# Patient Record
Sex: Female | Born: 1995 | Race: White | Hispanic: No | Marital: Single | Smoking: Never smoker
Health system: Southern US, Community
[De-identification: ages and names within clinical notes are randomized; demographics above are authoritative.]

## PROBLEM LIST (undated history)

## (undated) DIAGNOSIS — F32A Depression, unspecified: Secondary | ICD-10-CM

## (undated) DIAGNOSIS — S12500A Unspecified displaced fracture of sixth cervical vertebra, initial encounter for closed fracture: Secondary | ICD-10-CM

## (undated) DIAGNOSIS — G43909 Migraine, unspecified, not intractable, without status migrainosus: Secondary | ICD-10-CM

## (undated) DIAGNOSIS — S060X9A Concussion with loss of consciousness of unspecified duration, initial encounter: Secondary | ICD-10-CM

## (undated) DIAGNOSIS — B0089 Other herpesviral infection: Secondary | ICD-10-CM

## (undated) DIAGNOSIS — F419 Anxiety disorder, unspecified: Secondary | ICD-10-CM

## (undated) DIAGNOSIS — E282 Polycystic ovarian syndrome: Secondary | ICD-10-CM

## (undated) DIAGNOSIS — M545 Low back pain, unspecified: Secondary | ICD-10-CM

## (undated) DIAGNOSIS — N159 Renal tubulo-interstitial disease, unspecified: Secondary | ICD-10-CM

## (undated) DIAGNOSIS — S060XAA Concussion with loss of consciousness status unknown, initial encounter: Secondary | ICD-10-CM

## (undated) HISTORY — DX: Polycystic ovarian syndrome: E28.2

## (undated) HISTORY — DX: Other herpesviral infection: B00.89

## (undated) HISTORY — DX: Unspecified displaced fracture of sixth cervical vertebra, initial encounter for closed fracture: S12.500A

## (undated) HISTORY — DX: Concussion with loss of consciousness of unspecified duration, initial encounter: S06.0X9A

## (undated) HISTORY — DX: Renal tubulo-interstitial disease, unspecified: N15.9

## (undated) HISTORY — DX: Anxiety disorder, unspecified: F41.9

## (undated) HISTORY — DX: Concussion with loss of consciousness status unknown, initial encounter: S06.0XAA

## (undated) HISTORY — DX: Low back pain, unspecified: M54.50

## (undated) HISTORY — DX: Migraine, unspecified, not intractable, without status migrainosus: G43.909

## (undated) HISTORY — DX: Depression, unspecified: F32.A

---

## 2018-06-08 ENCOUNTER — Emergency Department: Payer: Self-pay

## 2018-06-08 ENCOUNTER — Emergency Department
Admission: EM | Admit: 2018-06-08 | Discharge: 2018-06-08 | Disposition: A | Discharge status: Home / Self Care | Payer: Worker's Comp, Other unspecified | Attending: Emergency Medical Services | Admitting: Emergency Medical Services

## 2018-06-08 DIAGNOSIS — Y9389 Activity, other specified: Secondary | ICD-10-CM | POA: Insufficient documentation

## 2018-06-08 DIAGNOSIS — M545 Low back pain, unspecified: Secondary | ICD-10-CM

## 2018-06-08 DIAGNOSIS — Y990 Civilian activity done for income or pay: Secondary | ICD-10-CM | POA: Insufficient documentation

## 2018-06-08 DIAGNOSIS — N390 Urinary tract infection, site not specified: Secondary | ICD-10-CM | POA: Insufficient documentation

## 2018-06-08 DIAGNOSIS — R319 Hematuria, unspecified: Secondary | ICD-10-CM | POA: Insufficient documentation

## 2018-06-08 DIAGNOSIS — X509XXA Other and unspecified overexertion or strenuous movements or postures, initial encounter: Secondary | ICD-10-CM | POA: Insufficient documentation

## 2018-06-08 DIAGNOSIS — Y92538 Other ambulatory health services establishments as the place of occurrence of the external cause: Secondary | ICD-10-CM | POA: Insufficient documentation

## 2018-06-08 LAB — URINALYSIS REFLEX TO MICROSCOPIC EXAM - REFLEX TO CULTURE
Bilirubin, UA: NEGATIVE
Glucose, UA: NEGATIVE
Ketones UA: NEGATIVE
Nitrite, UA: NEGATIVE
Protein, UR: 30 — AB
Specific Gravity UA: 1.024 (ref 1.001–1.035)
Urine pH: 6 (ref 5.0–8.0)
Urobilinogen, UA: NORMAL mg/dL (ref 0.2–2.0)

## 2018-06-08 LAB — HCG, SERUM, QUALITATIVE: Hcg Qualitative: NEGATIVE

## 2018-06-08 MED ORDER — DIAZEPAM 5 MG PO TABS
5.00 mg | ORAL_TABLET | Freq: Once | ORAL | Status: AC
Start: 2018-06-08 — End: 2018-06-08
  Administered 2018-06-08: 17:00:00 5 mg via ORAL
  Filled 2018-06-08: qty 1

## 2018-06-08 MED ORDER — ONDANSETRON HCL 4 MG/2ML IJ SOLN
4.00 mg | Freq: Once | INTRAMUSCULAR | Status: AC
Start: 2018-06-08 — End: 2018-06-08
  Administered 2018-06-08: 17:00:00 4 mg via INTRAVENOUS
  Filled 2018-06-08: qty 2

## 2018-06-08 MED ORDER — PREDNISONE 20 MG PO TABS
60.00 mg | ORAL_TABLET | Freq: Every day | ORAL | 0 refills | Status: AC
Start: 2018-06-08 — End: 2018-06-13

## 2018-06-08 MED ORDER — AMOXICILLIN-POT CLAVULANATE 875-125 MG PO TABS
1.00 | ORAL_TABLET | Freq: Two times a day (BID) | ORAL | 0 refills | Status: AC
Start: 2018-06-08 — End: 2018-06-15

## 2018-06-08 MED ORDER — OXYCODONE-ACETAMINOPHEN 5-325 MG PO TABS
ORAL_TABLET | ORAL | 0 refills | Status: AC
Start: 2018-06-08 — End: ?

## 2018-06-08 MED ORDER — DIAZEPAM 5 MG PO TABS
5.00 mg | ORAL_TABLET | Freq: Four times a day (QID) | ORAL | 0 refills | Status: AC | PRN
Start: 2018-06-08 — End: ?

## 2018-06-08 MED ORDER — HYDROMORPHONE HCL 0.5 MG/0.5 ML IJ SOLN
0.50 mg | Freq: Once | INTRAMUSCULAR | Status: AC
Start: 2018-06-08 — End: 2018-06-08
  Administered 2018-06-08: 0.5 mg via INTRAVENOUS
  Filled 2018-06-08: qty 0.5

## 2018-06-08 MED ORDER — SODIUM CHLORIDE 0.9 % IV BOLUS
1000.00 mL | Freq: Once | INTRAVENOUS | Status: AC
Start: 2018-06-08 — End: 2018-06-08
  Administered 2018-06-08: 17:00:00 1000 mL via INTRAVENOUS

## 2018-06-08 NOTE — Discharge Instructions (Signed)
Dear Ms. [name] [name]:    I appreciate your choosing the [facility] for your healthcare needs, and hope your visit today was EXCELLENT.    Instructions:  Please follow-up with the [facility] and your primary doctor in 1-2 days. If you don't have a primary doctor, you can see Dr. Stacie Glaze.    Return to the Emergency Department for any worsening symptoms or concerns.    Below is some information that our patients often find helpful.    We wish you good health and please do not hesitate to contact us if we can ever be of any assistance.    Sincerely,  Harden Mo, MD  [facility]    ________________________________________________________________    If you do not continue to improve or your condition worsens, please contact your doctor or return immediately to the Emergency Department.    Thank you for choosing [facility] for your emergency care needs.  We strive to provide EXCELLENT care to you and your family.      DOCTOR REFERRALS  Call [phone] if you need any further referrals and we can help you find a primary care doctor or specialist.  Also, available online at:  [email or web address]    YOUR CONTACT INFORMATION  Before leaving please check with registration to make sure we have an up-to-date contact number.  You can call registration at [phone] to update your information.  For questions about your hospital bill, please call [phone].  For questions about your Emergency Dept Physician bill please call [phone].      FREE HEALTH SERVICES  If you need help with health or social services, please call 2-1-1 for a free referral to resources in your area.  2-1-1 is a free service connecting people with information on health insurance, free clinics, pregnancy, mental health, dental care, food assistance, housing, and substance abuse counseling.  Also, available online at:  http://www.[facility].org    MEDICAL  RECORDS AND TESTS  Certain laboratory test results do not come back the same day, for example urine cultures.   We will contact you if other important findings are noted.  Radiology films are often reviewed again to ensure accuracy.  If there is any discrepancy, we will notify you.      Please call [phone] to pick up a complimentary CD of any radiology studies performed.  If you or your doctor would like to request a copy of your medical records, please call [phone].      ORTHOPEDIC INJURY   Please know that significant injuries can exist even when an initial x-ray is read as normal or negative.  This can occur because some fractures (broken bones) are not initially visible on x-rays.  For this reason, close outpatient follow-up with your primary care doctor or bone specialist (orthopedist) is required.    MEDICATIONS AND FOLLOWUP  Please be aware that some prescription medications can cause drowsiness.  Use caution when driving or operating machinery.    The examination and treatment you have received in our Emergency Department is provided on an emergency basis, and is not intended to be a substitute for your primary care physician.  It is important that your doctor checks you again and that you report any new or remaining problems at that time.      24 HOUR PHARMACIES  [facility] - [address] [place], [place] [address] (1.4 miles, 7 minutes)  [facility] -  [address], [place],  [address] (6.5 miles, 13 minutes)  Handout with directions available on request    PATIENT RELATIONS  If you have any concerns, issues, or feedback related to your care, positive or negative, please do not hesitate to contact Patient Relations at [phone]. They are open from 8:30AM-5:00PM Monday through Friday.

## 2018-06-08 NOTE — ED Provider Notes (Signed)
Physician/Midlevel provider first contact with patient: 06/08/18 1453         EMERGENCY DEPARTMENT HISTORY AND PHYSICAL EXAM    Date: 06/08/18  Patient Name: [name] [name]  Attending Physician: Blanche East, MD  Patient DOB:  10/29/1995  MRN:  [id number]  Room:  33/SS 33        History of Presenting Illness     Chief Complaint:    Chief Complaint   Patient presents with    Back Pain       Historian: Patient with mom    23 y.o. female with h/o C spine fx s/t falling on ice as a child, p/w sudden onset of worsening lower back pain s/t an injury at work yesterday. Pt reports she was bending over to pick up an 80 lb dog, when the dog thrashed and pushed pt backwards. She denies any direct trauma to her back. She states her back pain was initially mild, but she noticed that it had worsened significantly when attempting to stand up from a seated position. Denies any bowel or bladder incontinence. She does note some BLE weakness, but denies paresthesias. She states she has been able to ambulate despite her back pain.      PMD: Pcp, None, MD      Past Medical History     Past Medical History:   Diagnosis Date    Anxiety     C6 cervical fracture     Concussion     Depression     Herpes simplex virus type 1 (HSV-1) dermatitis     Kidney infection     Migraines     PCOS (polycystic ovarian syndrome)        Past Surgical History     Past Surgical History:   Procedure Laterality Date    ADENOIDECTOMY      TONSILLECTOMY      WISDOM TOOTH EXTRACTION         Family History     History reviewed. No pertinent family history.    Social History     Social History     Socioeconomic History    Marital status: Single     Spouse name: Not on file    Number of children: Not on file    Years of education: Not on file    Highest education level: Not on file   Occupational History    Not on file   Social Needs    Financial resource strain: Not on file    Food insecurity:     Worry: Not on file     Inability: Not on file     Transportation needs:     Medical: Not on file     Non-medical: Not on file   Tobacco Use    Smoking status: Never Smoker    Smokeless tobacco: Never Used   Substance and Sexual Activity    Alcohol use: Yes     Comment: socially    Drug use: Never    Sexual activity: Not on file   Lifestyle    Physical activity:     Days per week: Not on file     Minutes per session: Not on file    Stress: Not on file   Relationships    Social connections:     Talks on phone: Not on file     Gets together: Not on file     Attends religious service: Not on file     Active member of club  or organization: Not on file     Attends meetings of clubs or organizations: Not on file     Relationship status: Not on file    Intimate partner violence:     Fear of current or ex partner: Not on file     Emotionally abused: Not on file     Physically abused: Not on file     Forced sexual activity: Not on file   Other Topics Concern    Not on file   Social History Narrative    Not on file       Allergies     Allergies   Allergen Reactions    Bactrim [Sulfamethoxazole-Trimethoprim]        Home Medications     Home medications reviewed by ED MD     Previous Medications    NORETHINDRONE ACET-ETHINYL EST (JUNEL 1/20 PO)    Take by mouth    VALACYCLOVIR HCL (VALTREX PO)    Take by mouth         Review of Systems     Constitutional:  No fever  Eyes: No discharge   ENT: No ST  CV:  No CP   Resp:  No SOB or cough  GI: No abd pain, N, V, D, or bowel incontinence.  GU: No dysuria or bladder incontinence  MS: +Back pain. +BLE weakness. No paresthesias.  Skin: No rash  Neuro:  No HA  Psych:  No behavior changes  All other systems reviewed and negative      Physical Exam     BP (!) 170/92    Pulse (!) 102    Temp 98.1 F (36.7 C) (Oral)    Resp 22    Wt 101 kg    LMP 05/08/2018 (Approximate)    SpO2 98%       CONSTITUTIONAL Patient is afebrile, Vital signs reviewed, hypertensive, tachycardic.  HEAD Atraumatic, Normocephalic.  EYES No discharge from  eyes, Sclera are normal.  NECK   Normal ROM, Cervical spine nontender  RESPIRATORY CHEST Chest is nontender, Breath sounds normal, No respiratory distress.  CARDIOVASCULAR Tachycardic rate, Heart sounds normal.  ABDOMEN Abdomen is nontender, No peritoneal signs, No distension  BACK   Paravertebral L spine TTP. Straight leg raise negative bilaterally. There is no CVA tenderness.  UPPER EXTREMITY No cyanosis, No edema  LOWER EXTREMITY Good strength bilateral legs. Pt is able to lift both legs off of the bed.  NEURO GCS is 15, No focal motor deficits, No focal sensory deficits.  SKIN Skin is warm, Skin is dry.  PSYCHIATRIC Normal affect, Normal insight      Monitors, EKG     EKG (interpreted by ED physician): na      Orders Placed During This Encounter     Orders Placed This Encounter   Procedures    Urine culture    CT L- Spine without Contrast    Beta HCG, Qual, Serum    UA Reflex to Micro - Reflex to Culture         ED Medications Administered     ED Medication Orders (From admission, onward)    Start Ordered     Status Ordering Provider    06/08/18 1500 06/08/18 1459  HYDROmorphone (DILAUDID) injection 0.5 mg  Once     Route: Intravenous  Ordered Dose: 0.5 mg     Last MAR action:  Given Raeden Schippers L    06/08/18 1500 06/08/18 1459  ondansetron (ZOFRAN) injection 4 mg  Once     Route: Intravenous  Ordered Dose: 4 mg     Last MAR action:  Given Cassandra Harbold L    06/08/18 1500 06/08/18 1459  sodium chloride 0.9 % bolus 1,000 mL  Once     Route: Intravenous  Ordered Dose: 1,000 mL     Last MAR action:  [employer], Janiel Derhammer L    06/08/18 1500 06/08/18 1459  diazePAM (VALIUM) tablet 5 mg  Once     Route: Oral  Ordered Dose: 5 mg     Last MAR action:  Given Leopold Smyers, Kellogg L                Data Review     Nursing Records Reviewed and Agree: Yes  Laboratory results reviewed by ED provider: if applicable yes  Radiologic study results reviewed by ED provider:  If applicable yes      I, Blanche East, MD, personally performed the  services documented. Skip Estimable is scribing for me on [name],[name]. I reviewed and confirm the accuracy of the information in this medical record.    Tacy Dura, am serving as a Neurosurgeon to document services personally performed by Blanche East, MD, based on the provider's statements to me.     Credentials: Skip Estimable, scribe    Rendering Provider: Blanche East, MD      Diagnostic Study Results     Labs     Results     Procedure Component Value Units Date/Time    Beta HCG, Drema Dallas Serum [[id number]] Collected:  06/08/18 1552    Specimen:  Blood Updated:  06/08/18 1614     Hcg Qualitative Negative    UA Reflex to Micro - Reflex to Culture [[id number]]  (Abnormal) Collected:  06/08/18 1552     Updated:  06/08/18 1612     Urine Type Urine, Clean Ca     Color, UA Yellow     Clarity, UA Hazy     Specific Gravity UA 1.024     Urine pH 6.0     Leukocyte Esterase, UA Large     Nitrite, UA Negative     Protein, UR 30     Glucose, UA Negative     Ketones UA Negative     Urobilinogen, UA Normal mg/dL      Bilirubin, UA Negative     Blood, UA Small     RBC, UA 11 - 25 /hpf      WBC, UA 11 - 25 /hpf      Squamous Epithelial Cells, Urine 6 - 10 /hpf      Urine Mucus Present    Narrative:       Replace urinary catheter prior to obtaining the urine culture  if it has been in place for greater than or equal to 14  days:->N/A No Foley  Indications for U/A Reflex to Micro - Reflex to  Culture:->Suprapubic Pain/Tenderness or Dysuria          Radiologic Studies  Radiology Results (24 Hour)     Procedure Component Value Units Date/Time    CT L- Spine without Contrast [[id number]] Collected:  06/08/18 1655    Order Status:  Completed Updated:  06/08/18 1701    Narrative:       HISTORY: 23 years old, pain        TECHNIQUE: Routine CT lumbar spine without  contrast.  Dose reduction with automatic exposure control, iterative  reconstruction, and/or adjustment of the mA and/or kV  according to  patient size.  COMPARISON: [name].  FINDINGS:                There is no evidence of pars defect.          The bones are without evidence of lytic or blastic lesion.        MRI is more accurate in evaluation for disc herniation and central canal  pathology with following findings on current CT scan.   Segmental analysis as below:      At L1-L2: No herniation or stenosis.  At L2-L3: No herniation or stenosis.  At L3-L4: No herniation or stenosis.  At L4-L5: No herniation or stenosis.  At L5-S1: No herniation or stenosis.      Impression:           1.  Negative CT LUMBAR spine for acute process.              Einar Pheasant, MD   06/08/2018 4:57 PM      .        Procedures     na    Clinical Notes, Consults & Reevaluations, and MDM     Differential Diagnoses:   2:56 PM Early differential includes, but is not limited to: herniated disc, musculoskeletal strain, L spine fx vs contusion, pregnancy, UTI.      Consults/Reevaluation:   5:04 PM Re-eval:   Feeling much better, VS stable,  no acute distress, looks well.     Counseled re dx  Counseled re follow up  Answered all questions  Counseled red flags and signs and sxs to return for.  Comfortable with follow up and discharge plan    Return Precautions  The patient is aware that this evaluation is only a screening for emergent conditions related to his or her symptoms and presentation.   I discussed the need for prompt follow-up.  Patient demonstrates verbal understanding.  Patient advised to return to the ED for any worsening symptoms, uncontrolled pain if applicable, worsening fevers, or any changes in their condition prompting concern and need for repeat evaluation and/or additional management.      MDM:   5:05 PM Hx, PEx, labs, and imaging were done to evaluate pt. Pt will be started on abx for UTI. She will also f/u with the [name] for back pain.      Diagnosis and Disposition   Diagnosis/Clinical Impression:  1. Low back pain, unspecified back pain laterality, unspecified chronicity, unspecified whether sciatica  present    2. Urinary tract infection with hematuria, site unspecified        Disposition  ED Disposition     ED Disposition Condition Date/Time Comment    Discharge  Fri Jun 08, 2018  5:05 PM Glenice Laine discharge to home/self care.    Condition at disposition: Stable          Prescriptions    New Prescriptions    AMOXICILLIN-CLAVULANATE (AUGMENTIN) 875-125 MG PER TABLET    Take 1 tablet by mouth 2 (two) times daily for 7 days    DIAZEPAM (VALIUM) 5 MG TABLET    Take 1 tablet (5 mg total) by mouth every 6 (six) hours as needed for Anxiety    OXYCODONE-ACETAMINOPHEN (PERCOCET) 5-325 MG PER TABLET    1 tab every 4 to 6 hours as needed for pain    PREDNISONE (DELTASONE) 20 MG TABLET    Take 3 tablets (60 mg total) by mouth daily for 5  days         Critical Care     na    Signout If Applicable     na       Harden Mo, MD  06/08/18 2131

## 2018-06-08 NOTE — ED Triage Notes (Signed)
Pt c/o lower back pain that is "on my spine" with movement, especially when going from sitting to standing or standing to sitting. 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 Went to [facility] last night and was given RXs for flexeril and tylenol 3 with minimal relief.

## 2018-06-11 ENCOUNTER — Ambulatory Visit: Payer: Self-pay

## 2018-06-11 ENCOUNTER — Telehealth: Payer: Self-pay

## 2018-06-11 NOTE — Progress Notes (Signed)
06/11/18 0800   Pain History   Pain Symptoms Pain   Pain Location Lower Back;Mid Back;Upper Back;Leg  Right;Leg  Left;Buttock  Right;Buttock  Left   Pain Description Radiating;Acute (<3 months);Sharp/Stabbing;Dull/Aching   Pain Frequency Constant   Sensation Symptoms Weakness   Sensation Location Leg  Right;Leg  Left   Sensation Frequency Intermittent   Foot Drop No   Incontinence No   Treatments   Have you visited a chiropractor for this problem? No   Have you ever had physical therapy for this problem? No   Have you ever received an injection for this problem? No   Diagnostic Tests   CT Scan Lumbar   Care Management   How did you hear about our program ED/[facility]  [facility] ED)   Have you had any prior spine surgeries? No   ISI Appointment   ISI Physician   Carlynn Purl, Mikle Bosworth)   ISI Appointment Date 06/12/18  (2:00 PM)   ISI Appointment Location [facility]

## 2018-06-12 ENCOUNTER — Other Ambulatory Visit: Payer: Self-pay | Admitting: Physical Medicine & Rehabilitation

## 2018-06-12 DIAGNOSIS — G589 Mononeuropathy, unspecified: Secondary | ICD-10-CM

## 2018-06-20 ENCOUNTER — Ambulatory Visit: Payer: Commercial Managed Care - POS

## 2018-09-21 NOTE — Progress Notes (Signed)
09/21/18 1500   ISI Appointment   ISI Physician Anastasia Pall, Mikle Bosworth   ISI Appointment Date 06/12/18  (2:00 PM)   ISI Appointment Location [facility]

## 2020-03-13 IMAGING — MR MRI CSPINE WO CONTRAST
5 series · 41 of 48 positions shown · non-contrast
Comparison: MRI brain study from same day.

HISTORY: Cervicalgia.
TECHNIQUE: Multiplanar, multisequential MRI of the cervical spine without intravenous contrast was performed.

[Series 1: bSSFP · axial · 6.0mm · 1.17mm/px · z∈[-51,+142]mm · 12 of 22 slices shown]
[im 1/22]
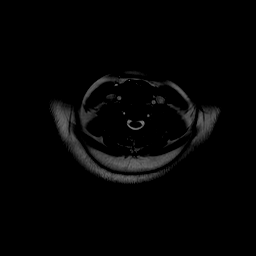
[im 2/22]
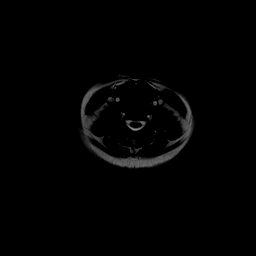
[im 4/22]
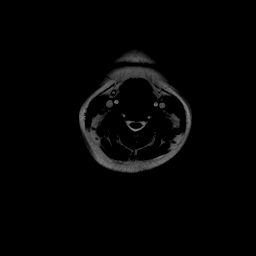
[im 6/22]
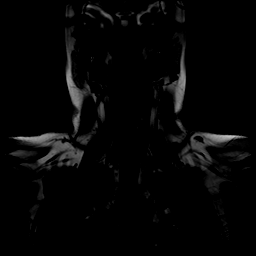
[im 8/22]
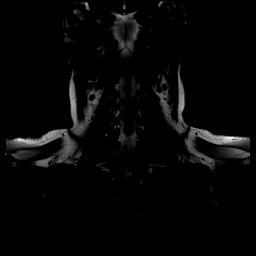
[im 10/22]
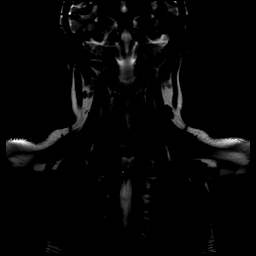
[im 12/22]
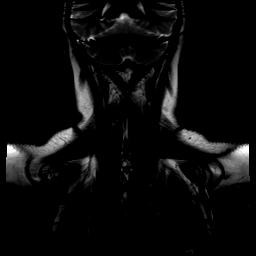
[im 14/22]
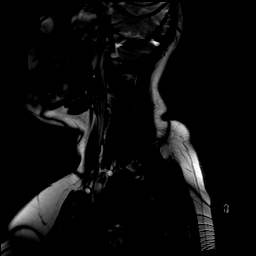
[im 16/22]
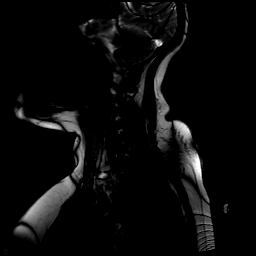
[im 18/22]
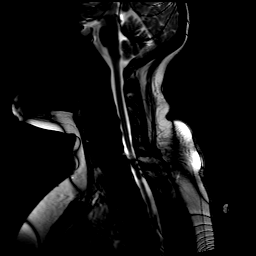
[im 20/22]
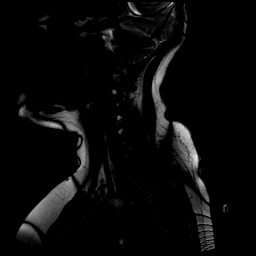
[im 22/22]
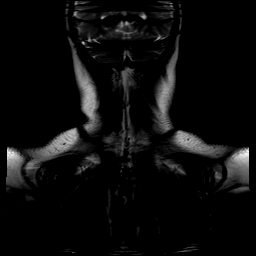

[Series 2: t2_sag · sagittal · 3.0mm · 0.62mm/px · 7 of 14 slices shown]
[im 1/14]
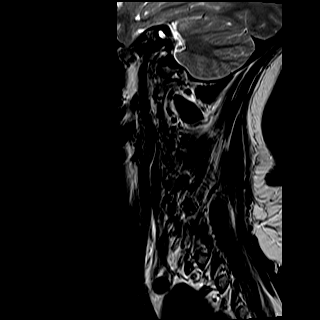
[im 3/14]
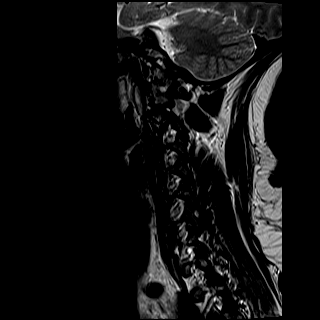
[im 5/14]
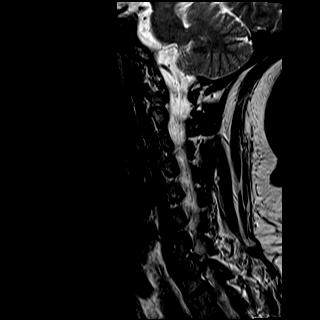
[im 7/14]
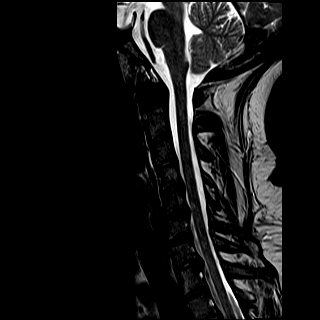
[im 9/14]
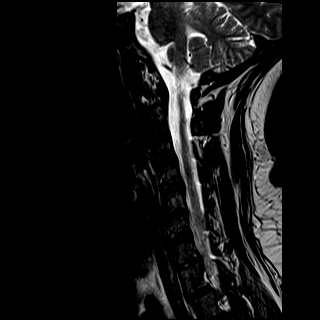
[im 11/14]
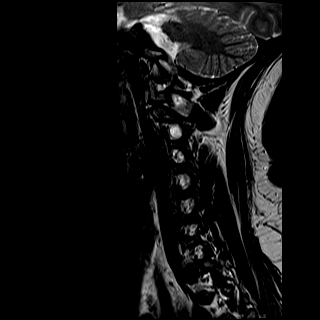
[im 14/14]
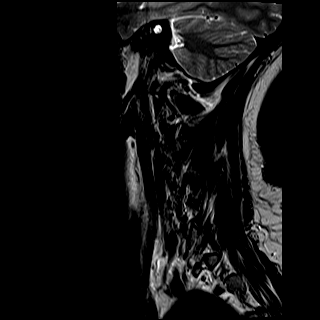

[Series 3: t1_sag · sagittal · 3.0mm · 0.62mm/px · 7 of 14 slices shown]
[im 1/14]
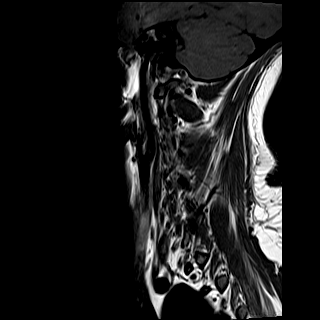
[im 3/14]
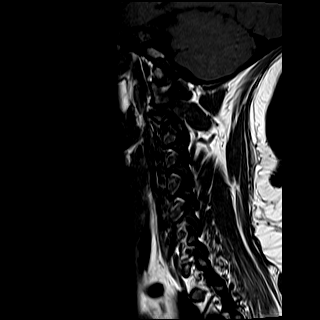
[im 5/14]
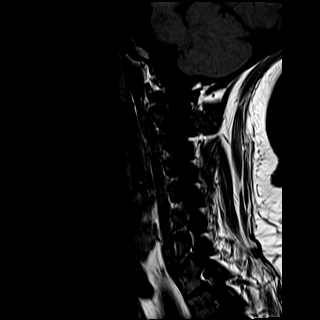
[im 7/14]
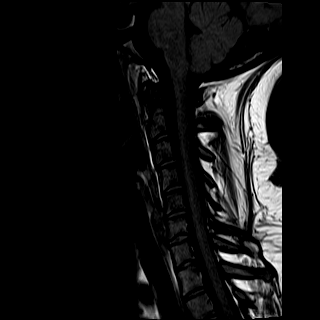
[im 9/14]
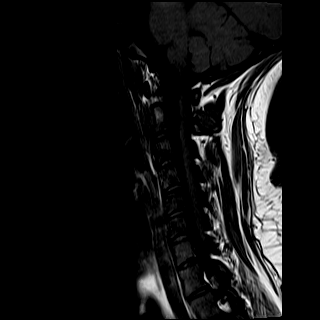
[im 11/14]
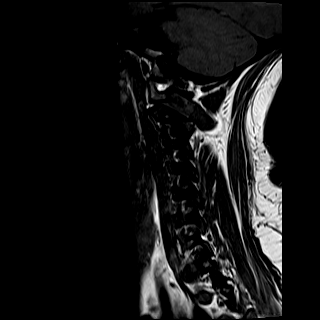
[im 14/14]
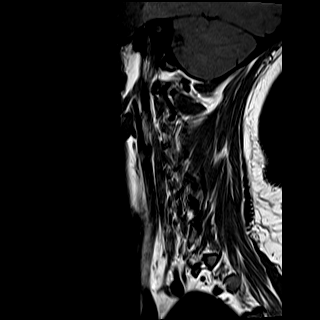

[Series 4: ir_sag · sagittal · 3.0mm · 0.62mm/px · 7 of 14 slices shown]
[im 1/14]
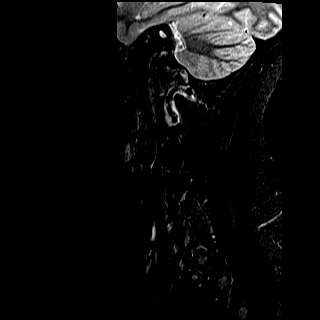
[im 3/14]
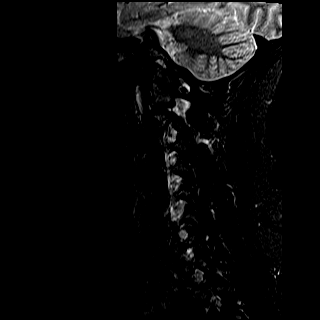
[im 5/14]
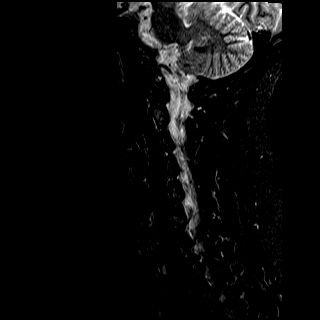
[im 7/14]
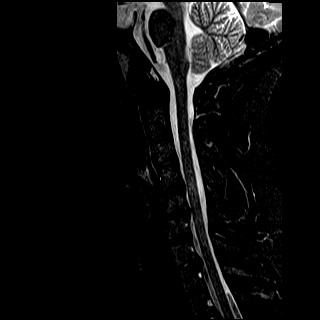
[im 9/14]
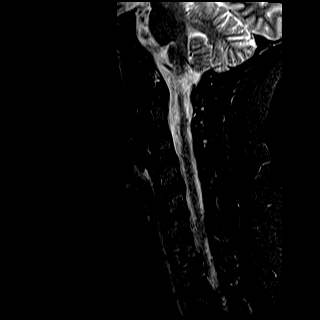
[im 11/14]
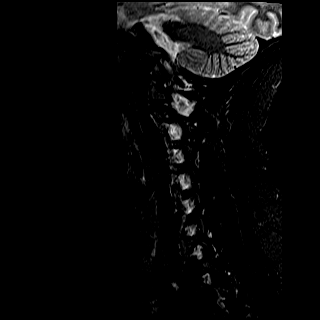
[im 14/14]
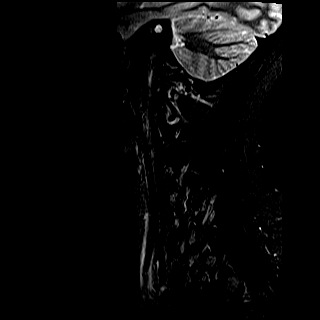

[Series 5: t2_medic_axial · axial · 3.0mm · 0.24mm/px · z∈[-75,+33]mm · 8 of 29 slices shown]
[im 1/29]
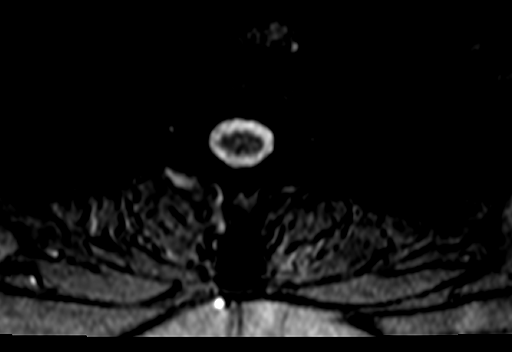
[im 5/29]
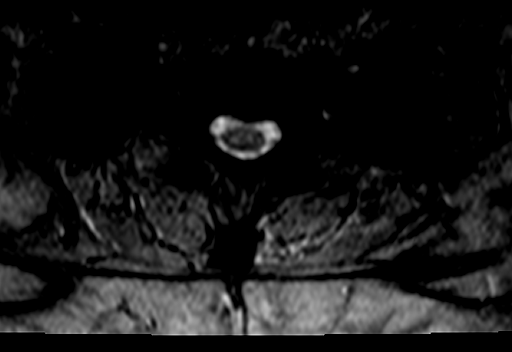
[im 9/29]
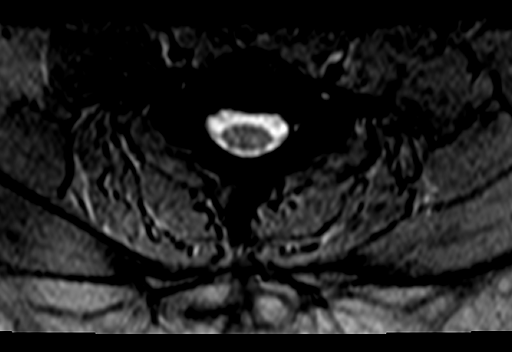
[im 13/29]
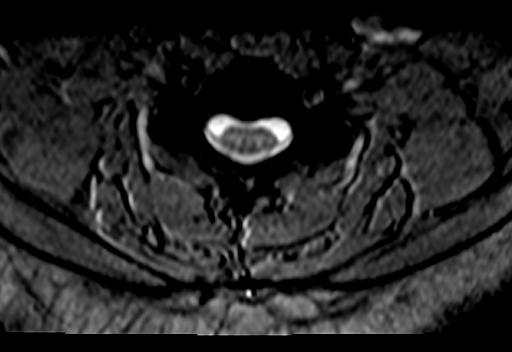
[im 17/29]
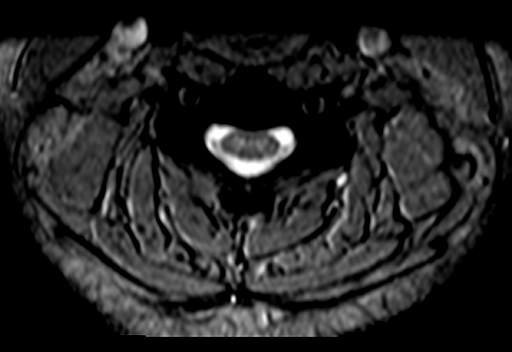
[im 21/29]
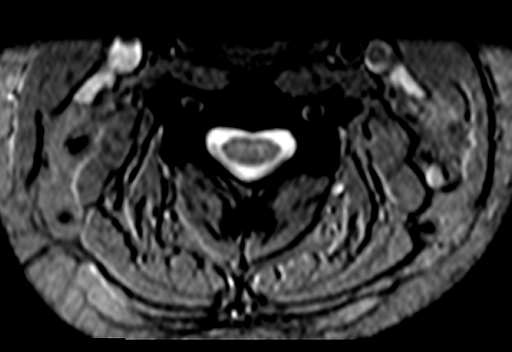
[im 25/29]
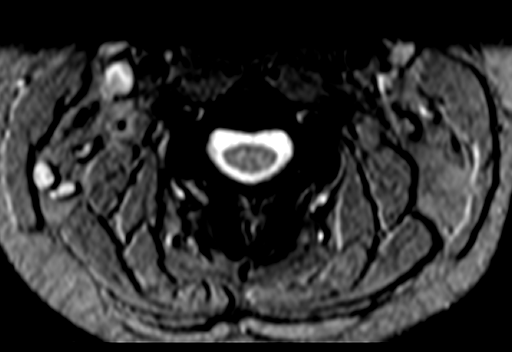
[im 29/29]
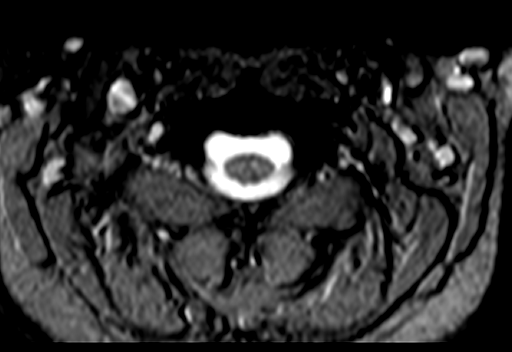

[41 of 48 positions shown; findings below may reference images not displayed]

FINDINGS: Seven non-rib-bearing cervical vertebrae seen. Slight reversal of normal lordotic cervical curvature with apex at C5 seen. Minimal endplate spur formation with mild degenerative endplate changes involving middle to lower cervical spine posteriorly seen. No fracture or spondylolisthesis is seen.

Slightly decreased disc T2 signal with areas of mild disc space narrowing of cervical spine seen.

C2-C3:  No disc herniation, spinal canal stenosis, or neural foraminal stenosis seen.

C3-C4:  No disc herniation, spinal canal stenosis, or neural foraminal stenosis identified.

C4-C5:  Disc osteophyte formation identified, most prominent at central and left paracentral region, producing overall mild thecal sac compression and mild spinal canal stenosis. Neural foramina are patent.

C5-C6:  Disc osteophyte formation identified, most prominent at right paracentral region, producing overall mild thecal sac compression and mild spinal canal stenosis. Neural foramina are patent.

C6-C7:  Mild disc bulge identified without disc herniation or spinal canal stenosis. Neural foramina are patent.

C7-T1:  Disc osteophyte formation seen, most prominent centrally, producing overall mild thecal sac compression and mild spinal canal stenosis. Mild right C8 neural foraminal stenosis due to uncovertebral spur formation seen. Left C8 neural foramen is patent.

Partially empty sella seen. Posterior cranial fossa and cervical and upper thoracic spinal cord are otherwise of normal signal and morphology.

Paravertebral soft tissues are unremarkable.
IMPRESSION: Degenerative changes of cervical spine with mild spinal canal stenosis at C4-C5 level, C5-C6 level, and C7-T1 level as noted.

Mild right C8 neural foraminal stenosis identified. No other area of neural foraminal stenosis seen.

Slight reversal of normal lordotic cervical curvature with apex at C5 seen.

## 2020-03-13 IMAGING — MR MRI BRAIN W/WO CONTRAST
10 series · 44 of 48 positions shown · IV contrast (prohance)
Comparison: None.

HISTORY: 24 year old female with cervicalgia, benign intracranial hypertension.
TECHNIQUE: Multiplanar, multisequence MRI images of the brain are obtained prior to and following intravenous contrast.

CONTRAST: 20 mL of ProHance

[Series 2: bSSFP · axial · 8.0mm · 1.17mm/px · z∈[+23,+188]mm · 4 of 19 slices shown]
[im 1/19]
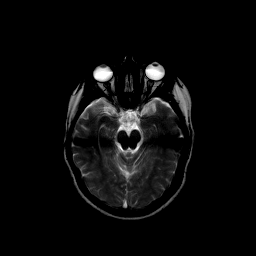
[im 7/19]
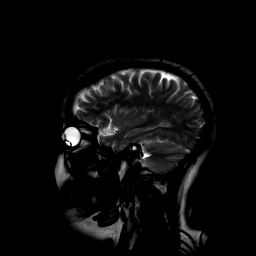
[im 13/19]
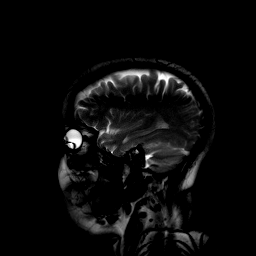
[im 19/19]
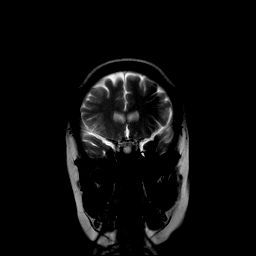

[Series 3: t1_sag · sagittal · 5.0mm · 0.75mm/px · 4 of 22 slices shown]
[im 1/22]
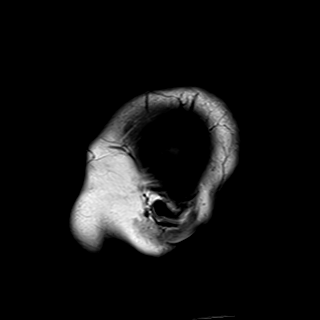
[im 8/22]
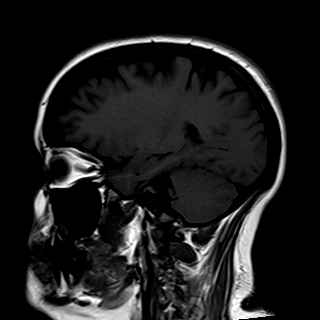
[im 15/22]
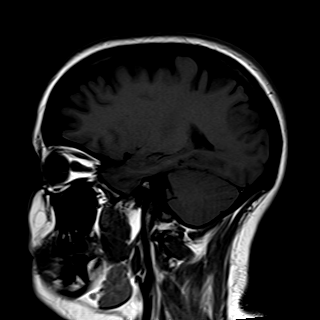
[im 22/22]
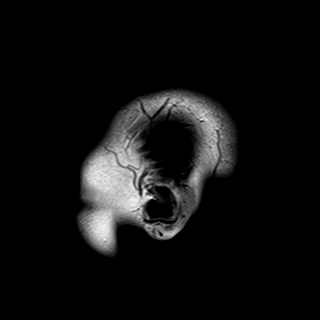

[Series 4: flair_axial_fs · axial · 5.0mm · 0.45mm/px · z∈[-31,+112]mm · 5 of 24 slices shown]
[im 1/24]
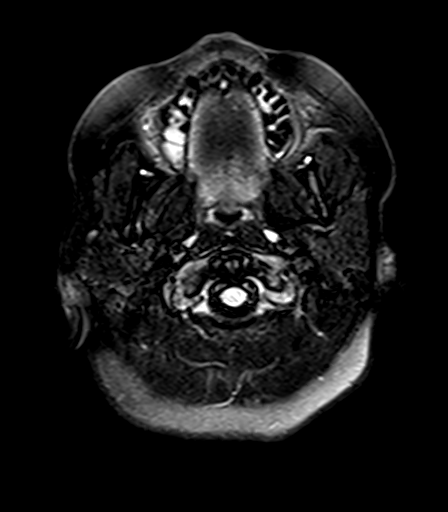
[im 6/24]
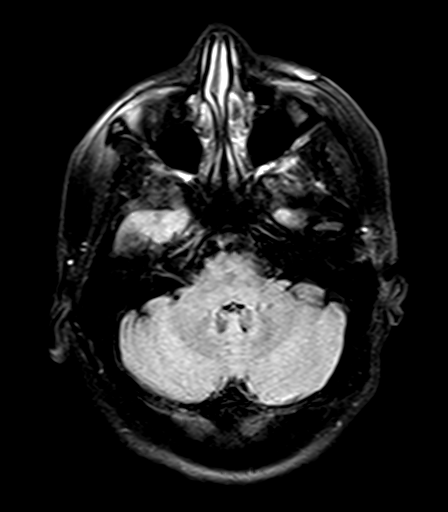
[im 12/24]
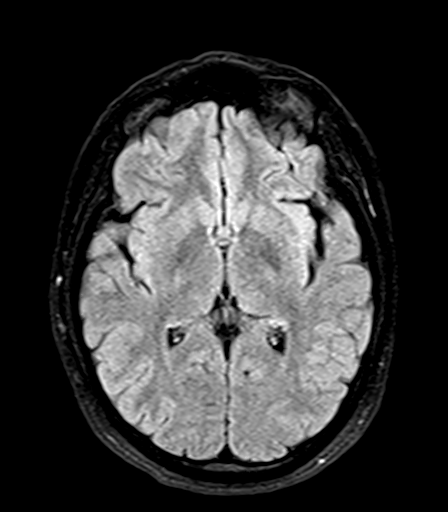
[im 18/24]
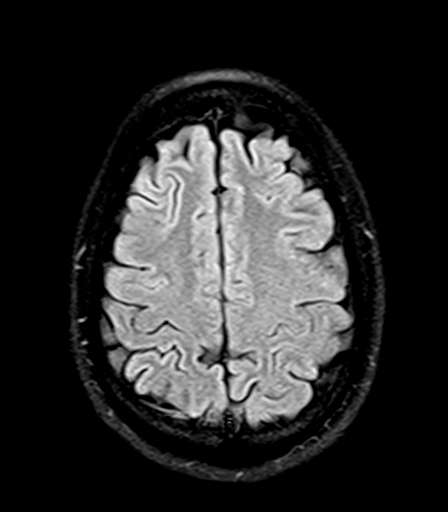
[im 24/24]
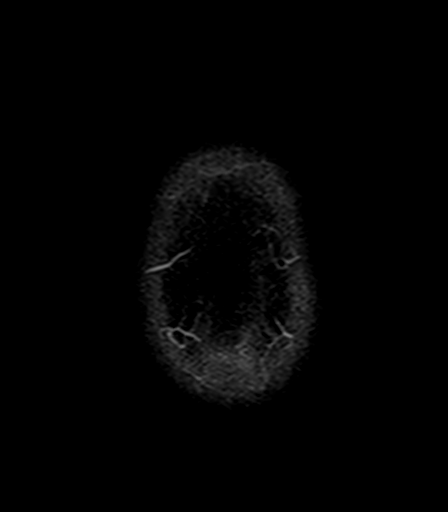

[Series 5: t2_axial · axial · 5.0mm · 0.72mm/px · z∈[-31,+112]mm · 5 of 24 slices shown]
[im 1/24]
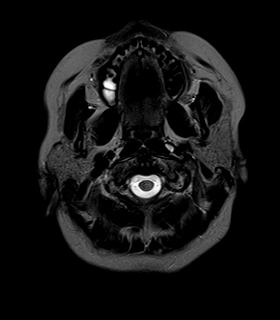
[im 6/24]
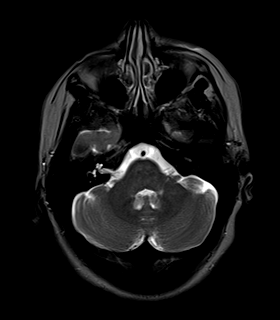
[im 12/24]
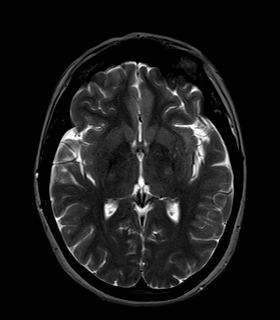
[im 18/24]
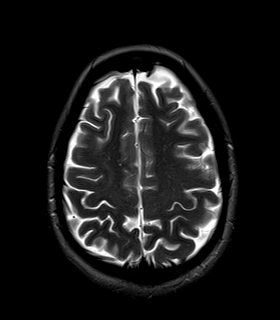
[im 24/24]
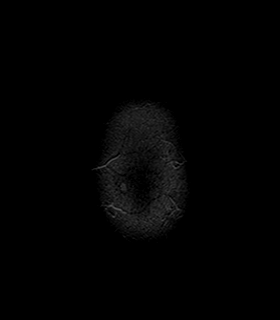

[Series 6: DWI · axial · 5.0mm · 1.26mm/px · z∈[-31,+112]mm · 5 of 24 slices shown (1 of 2)]
[im 1/24]
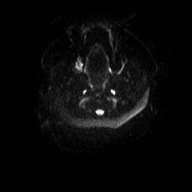
[im 6/24]
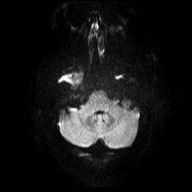
[im 12/24]
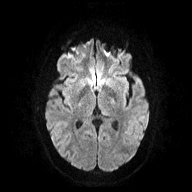
[im 18/24]
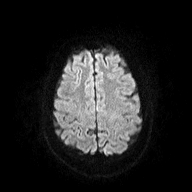
[im 24/24]
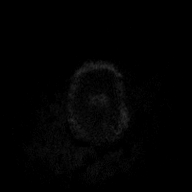

[Series 7: DWI · axial · 5.0mm · 1.26mm/px · z∈[-31,+112]mm · 5 of 24 slices shown (2 of 2)]
[im 1/24]
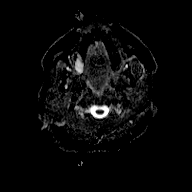
[im 6/24]
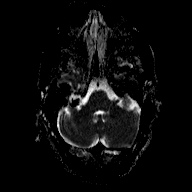
[im 12/24]
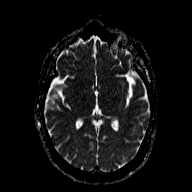
[im 18/24]
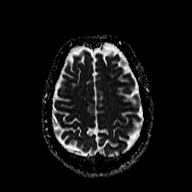
[im 24/24]
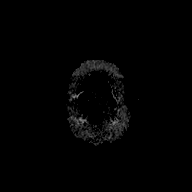

[Series 8: flash_axial · axial · 5.0mm · 0.45mm/px · z∈[-31,+112]mm · 5 of 24 slices shown]
[im 1/24]
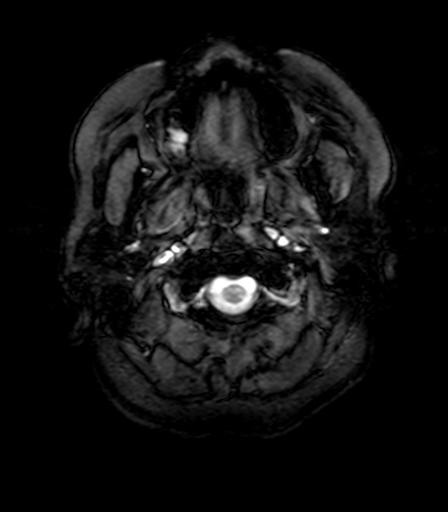
[im 6/24]
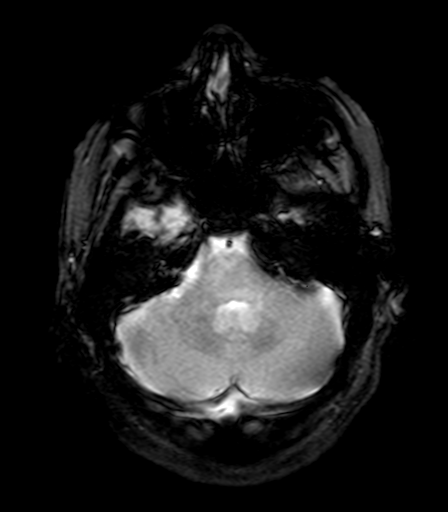
[im 12/24]
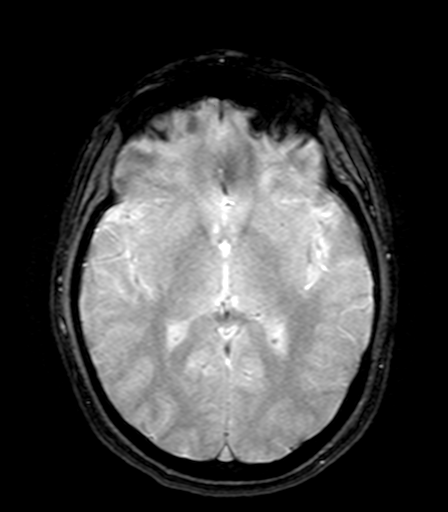
[im 18/24]
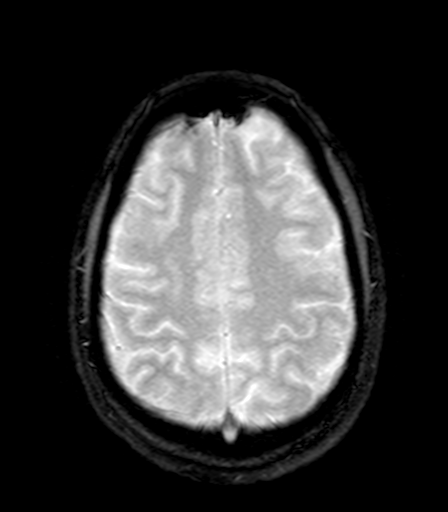
[im 24/24]
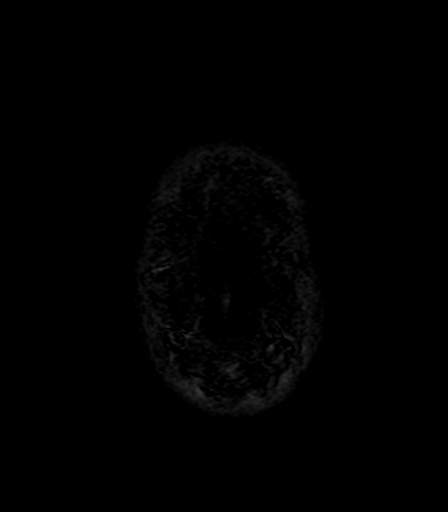

[Series 9: t1_axial · axial · 5.0mm · 0.72mm/px · z∈[-31,+112]mm · 5 of 24 slices shown]
[im 1/24]
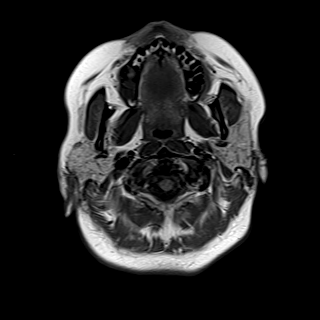
[im 6/24]
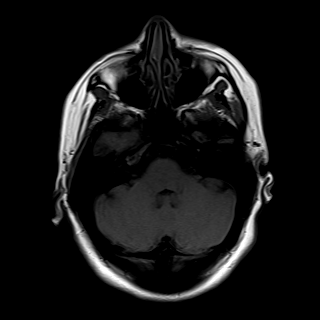
[im 12/24]
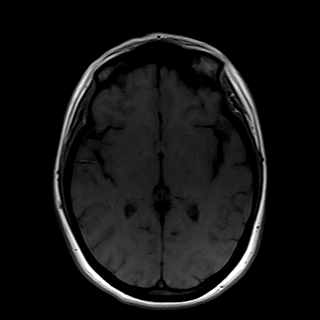
[im 18/24]
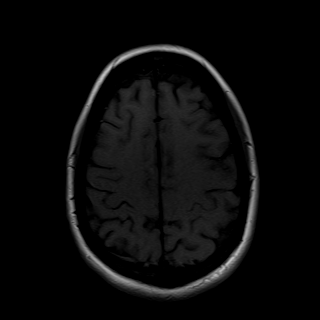
[im 24/24]
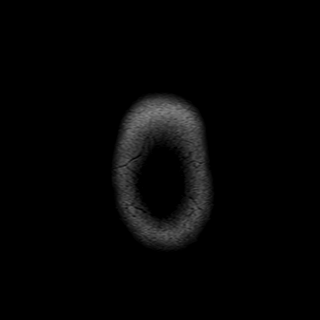

[Series 10: t1_axial_+c · axial · 5.0mm · 0.72mm/px · z∈[-31,+112]mm · 5 of 24 slices shown]
[im 1/24]
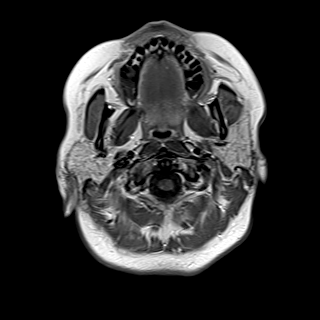
[im 6/24]
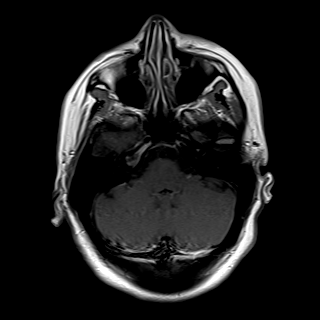
[im 12/24]
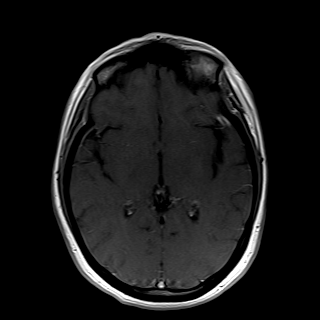
[im 18/24]
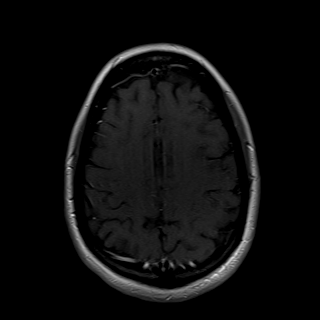
[im 24/24]
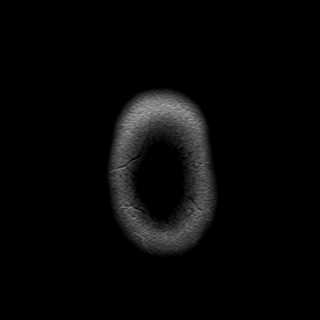

[Series 11: t1_cor_+c_fs · coronal · 5.0mm · 0.72mm/px · 1 of 27 slices shown]
[im 1/27]
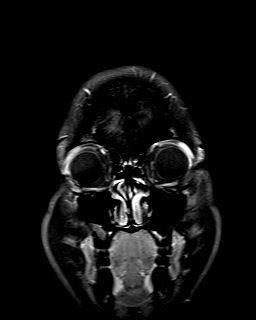

[44 of 48 positions shown; findings below may reference images not displayed]

FINDINGS: BRAIN PARENCHYMA: No acute infarct. No abnormal intracranial signal or susceptibility.

PITUITARY: Partial empty sella, which is a common finding.

VASCULATURE: Expected major intracranial flow voids are present.

VENTRICULAR SYSTEM: No hydrocephalus or midline shift.

EXTRA-AXIAL SPACES: No intra- or extra-axial fluid collections.

ORBITS: Unremarkable.

PARANASAL SINUSES AND MASTOID AIR CELLS: Visualized paranasal sinuses and mastoid air cells are predominantly clear.

BONES: Unremarkable.
IMPRESSION: 1.
No acute intracranial abnormality.

2.
Partial empty sella is nonspecific very common finding. Evaluation for deep venous sinus narrowing or thrombosis cannot be evaluated on a nonvascular-dedicated study, which is more specific MRI sign for intracranial hypertension. If further evaluation is warranted, MR venogram of the head without and with contrast is recommended.

## 2020-04-07 IMAGING — MR MR [PERSON_NAME] HEAD W/WO CONT
10 of 11 series · 33 of 48 positions shown · IV contrast (20CC PROHANCE)
Comparison: none

HISTORY: 24-year-old female with chronic headaches, dizziness, expressive aphasia, left eye twitching, history of pseudotumor.
TECHNIQUE: 2-D noncontrast time-of-flight MRV of the brain was performed without contrast in the sagittal, coronal, and axial planes. Additionally, 3-D postcontrast MRV of the head was obtained in axial projection. Post-processing software generated rotating 3D MIP images, under concurrent physician supervision.

CONTRAST: 20 mL of ProHance.
COMPARISONS: Brain MRI without and with contrast 03/13/2020

[Series 1: bSSFP · axial · 8.0mm · 1.17mm/px · 1 of 19 slices shown]
[im 1/19]
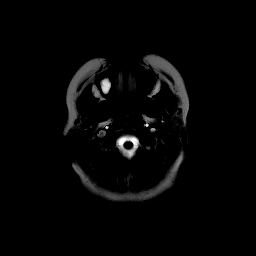

[Series 2: t1_sag · sagittal · 5.0mm · 0.75mm/px · 1 of 23 slices shown]
[im 1/23]
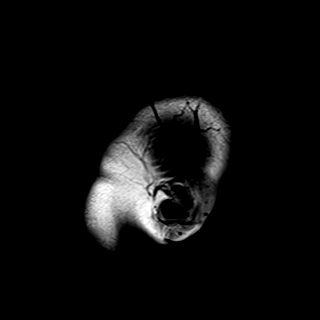

[Series 3: t1_axial_fs · axial · 5.0mm · 0.90mm/px · 1 of 24 slices shown]
[im 1/24]
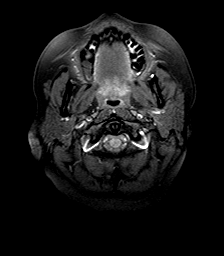

[Series 4: tof_2d_mrv_sag · sagittal · 3.0mm · 0.90mm/px · 3 of 74 slices shown]
[im 1/74]
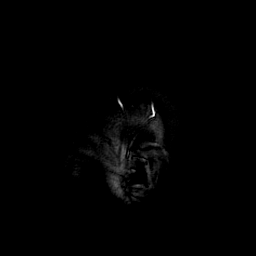
[im 37/74]
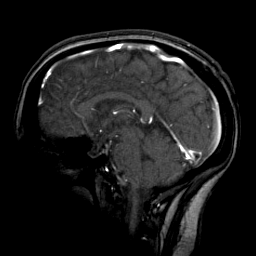
[im 74/74]
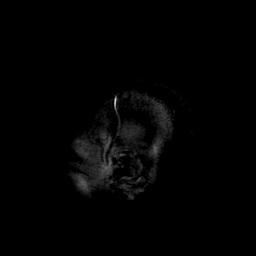

[Series 8: tof_2d_mrv_cor · coronal · 3.0mm · 0.85mm/px · 5 of 95 slices shown]
[im 1/95]
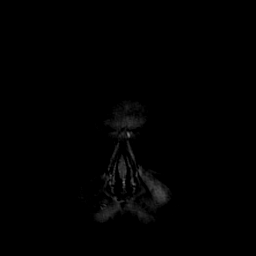
[im 24/95]
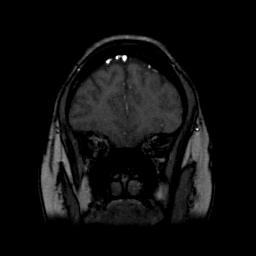
[im 48/95]
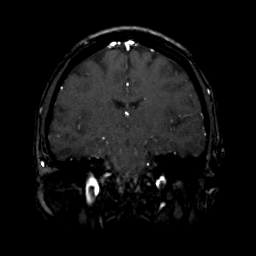
[im 71/95]
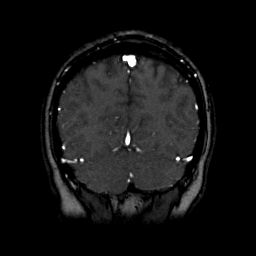
[im 95/95]
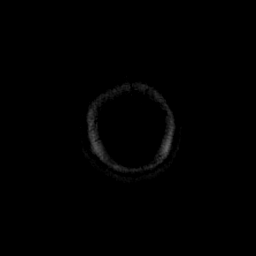

[Series 9: 2d_tof_mrv_sag_rotation · sagittal · 0.98mm/px · 1 of 3 slices shown]
[im 1/3]
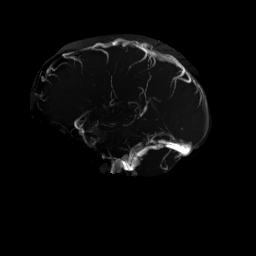

[Series 14: tof_2d_mrv_axial · axial · 3.0mm · 0.98mm/px · z∈[-53,+96]mm · 4 of 75 slices shown]
[im 1/75]
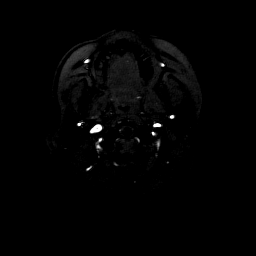
[im 25/75]
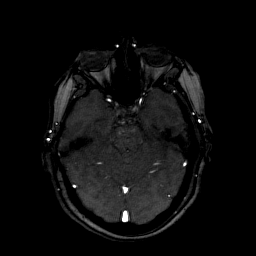
[im 50/75]
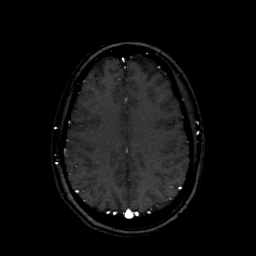
[im 75/75]
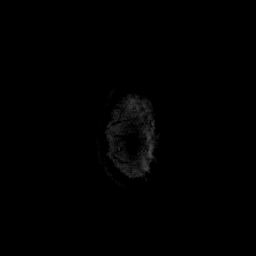

[Series 19: fl_3d_sag_mask_tt=1.0s · sagittal · 1.0mm · 0.45mm/px · 8 of 160 slices shown]
[im 1/160]
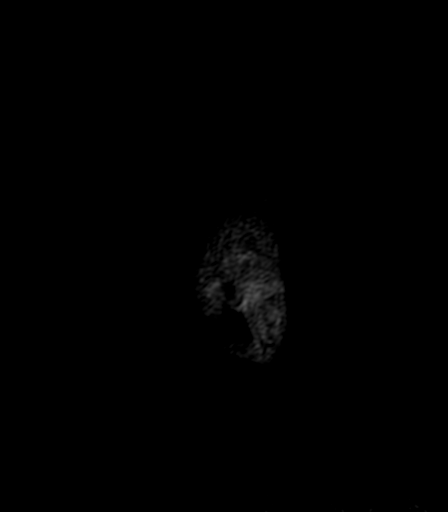
[im 23/160]
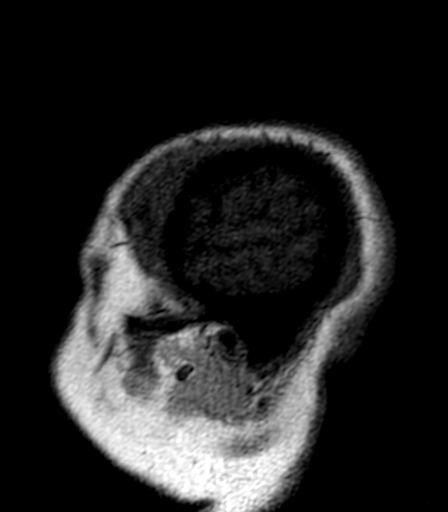
[im 46/160]
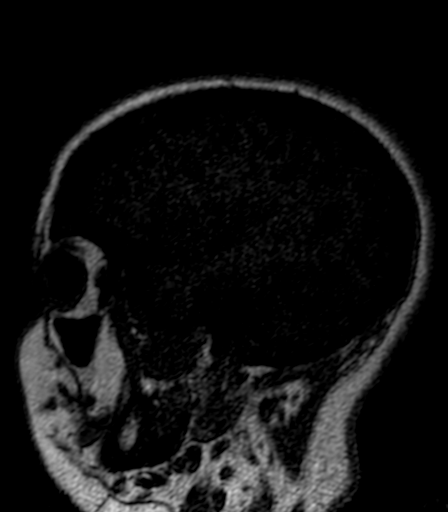
[im 69/160]
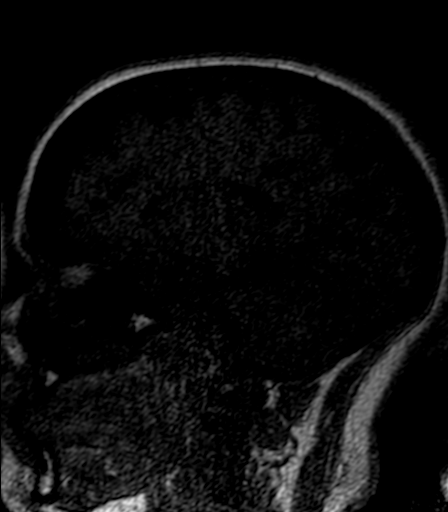
[im 91/160]
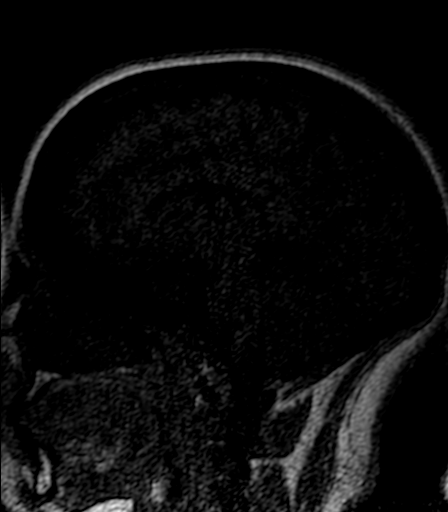
[im 114/160]
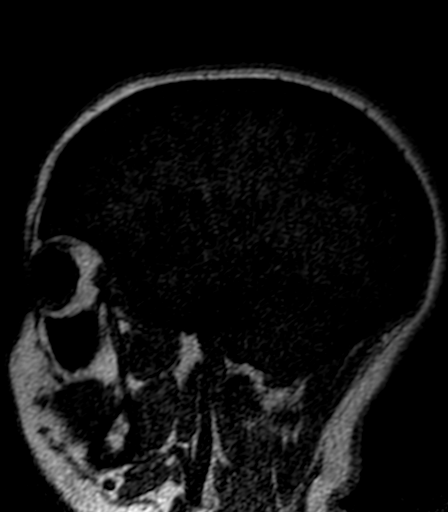
[im 137/160]
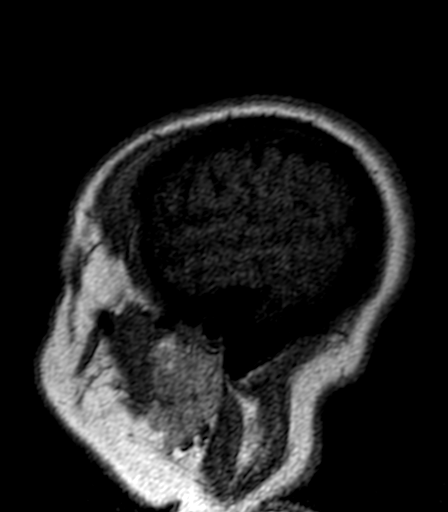
[im 160/160]
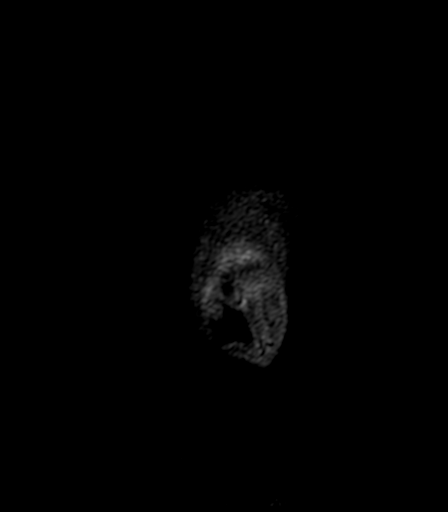

[Series 21: fl_3d_sag_arterial_20_sec_tt=1.0s · sagittal · 1.0mm · 0.45mm/px · 8 of 157 slices shown]
[im 1/157]
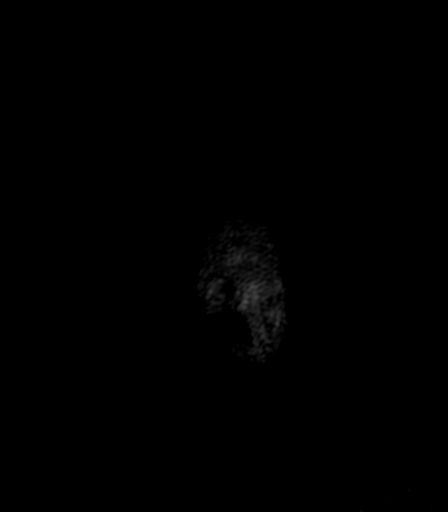
[im 23/157]
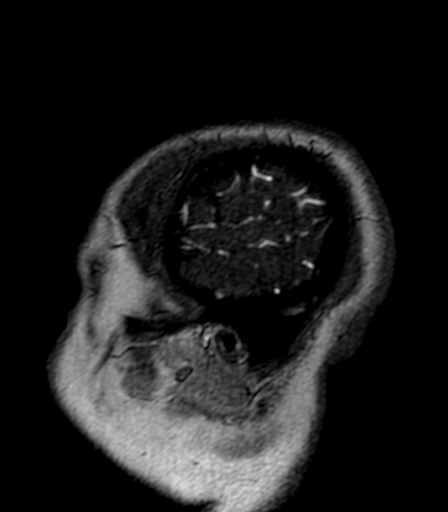
[im 45/157]
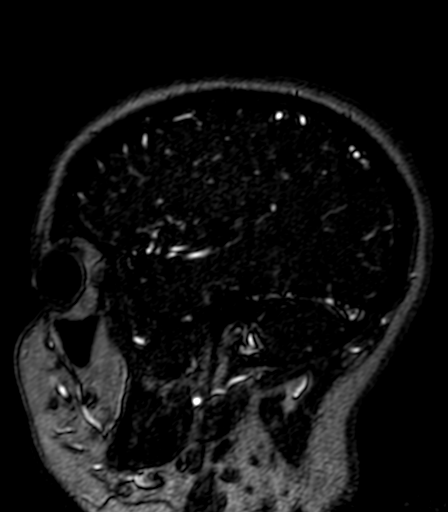
[im 67/157]
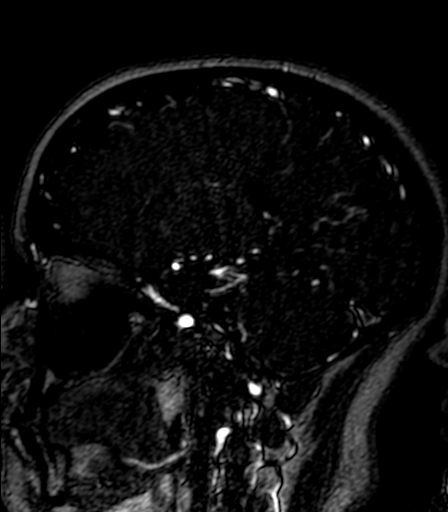
[im 90/157]
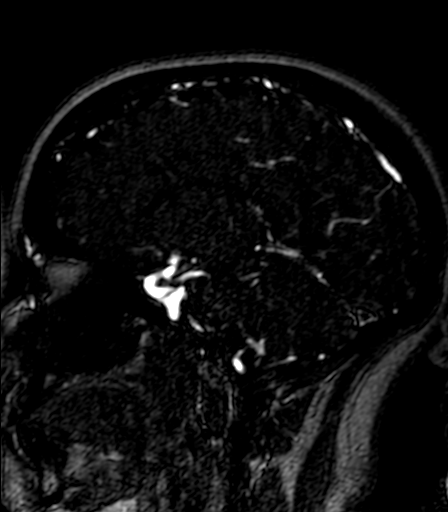
[im 112/157]
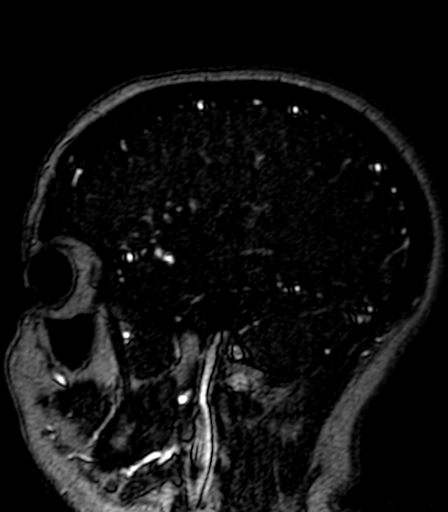
[im 134/157]
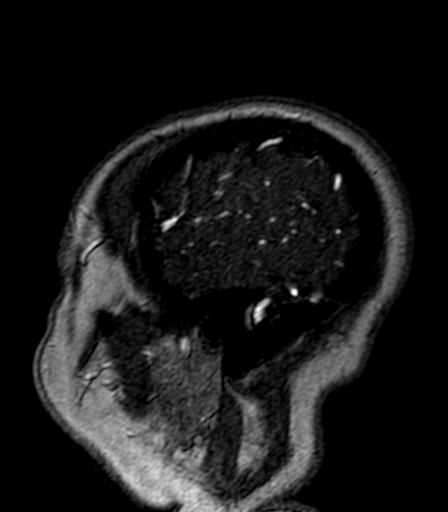
[im 157/157]
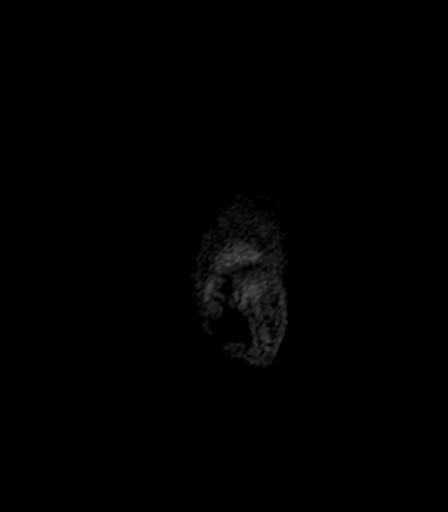

[Series 26: fl_3d_sag_venous_30_sec_tt=1.0s · sagittal · 1.0mm · 0.45mm/px · 1 of 160 slices shown]
[im 1/160]
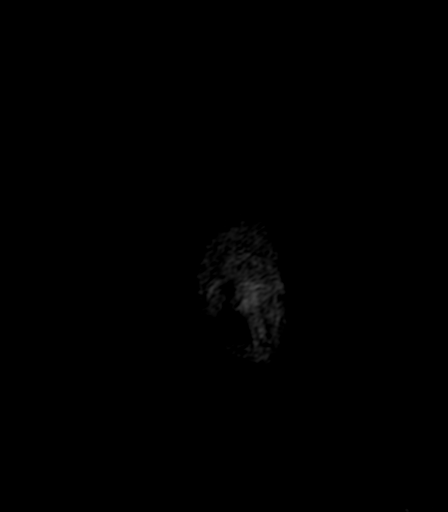

[33 of 48 positions shown; findings below may reference images not displayed]

FINDINGS: SUPERIOR SAGITTAL SINUS: No stenosis, occlusion, or filling defect.

INFERIOR SAGITTAL SINUS: No stenosis, occlusion, or filling defect.

INTERNAL CEREBRAL VEINS: No stenosis, occlusion, or filling defect.

STRAIGHT SINUS: No stenosis, occlusion, or filling defect.

TRANSVERSE/SIGMOID SINUSES: There is narrowing of the junctions of the transverse and sigmoid sinuses.

UPPER JUGULAR VEINS: No stenosis, occlusion, or filling defect.
IMPRESSION: There is narrowing of the junctions of the transverse and sigmoid sinuses, which can be seen with intracranial hypertension. Consider referral to neurology for further workup.

## 2020-04-27 IMAGING — CT CT ABD/PEL WO (STONE PROTOCOL)
2 of 4 series · 14 of 46 positions shown, 16 images · non-contrast
Comparison: None

HISTORY: Right flank pain
TECHNIQUE: Axial images of the abdomen and pelvis are obtained from the hemidiaphragms to the pubic symphysis without intravenous contrast. Coronal and sagittal images were reformatted.

[Series 2: soft tissue · axial · 0.77mm/px · z∈[-1596,-1251]mm · 11 of 137 slices shown, 13 images]
[im 11/137  soft-tissue]
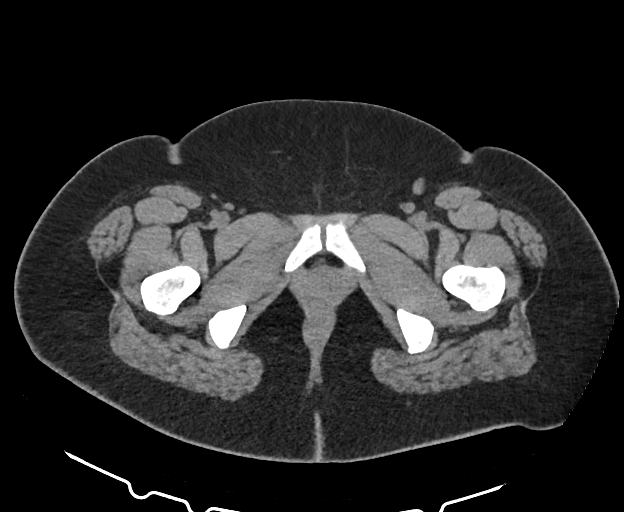
[im 11/137  bone]
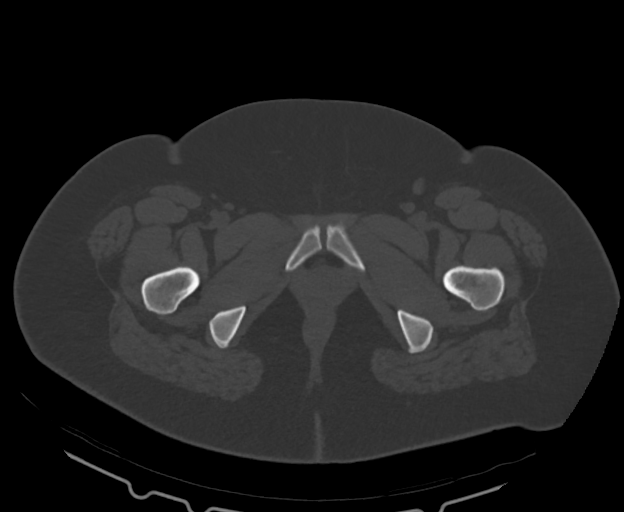
[im 21/137  soft-tissue]
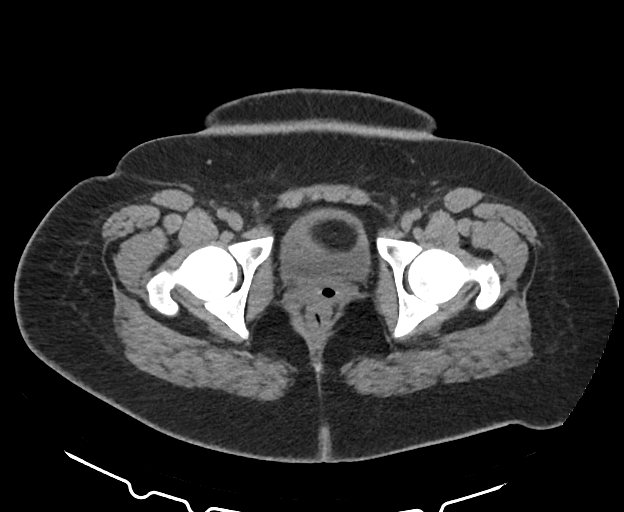
[im 31/137  soft-tissue]
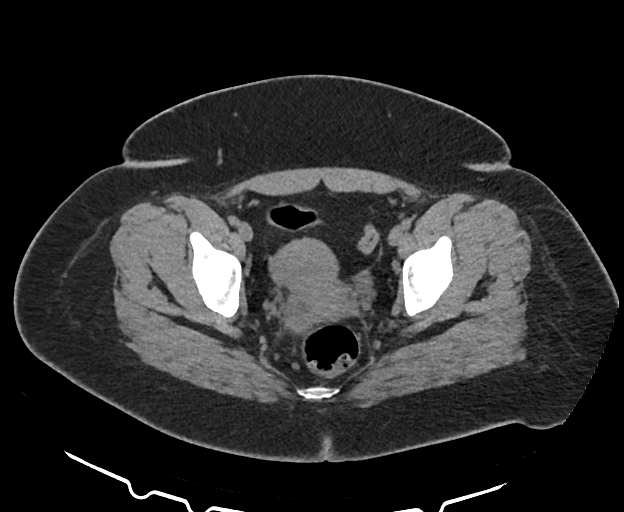
[im 46/137  soft-tissue]
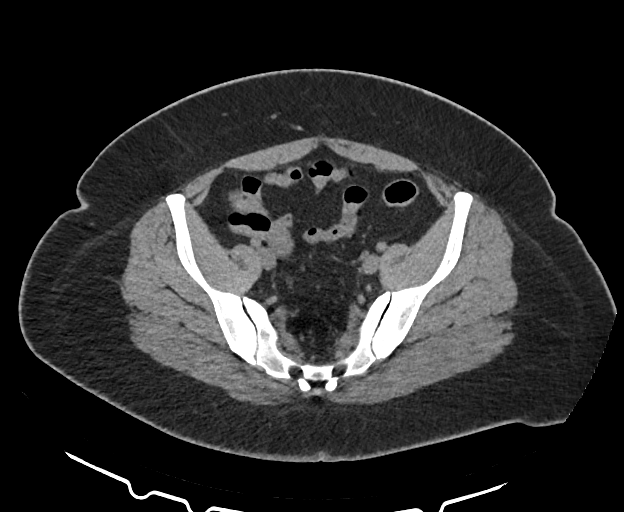
[im 56/137  soft-tissue]
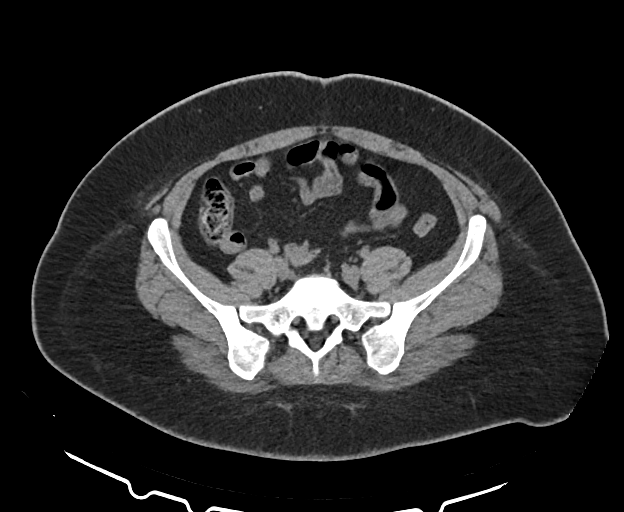
[im 71/137  soft-tissue]
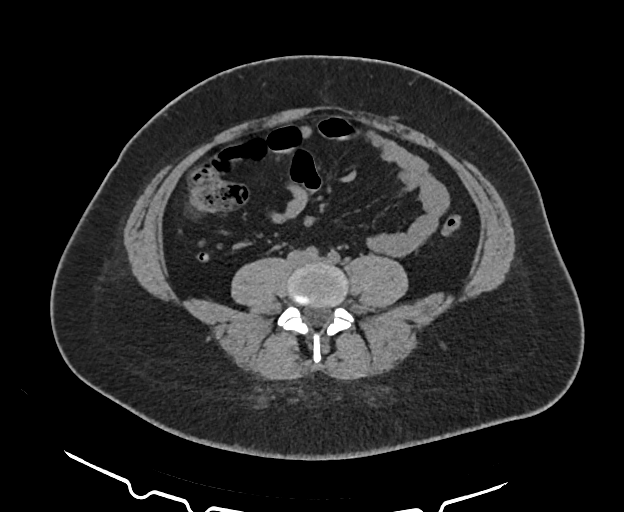
[im 81/137  soft-tissue]
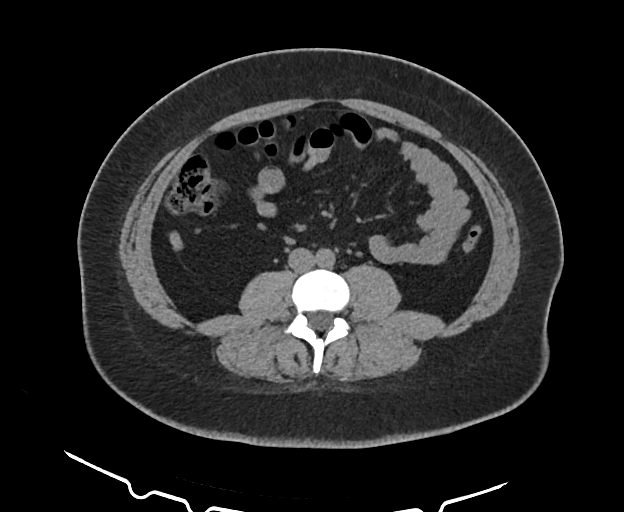
[im 91/137  soft-tissue]
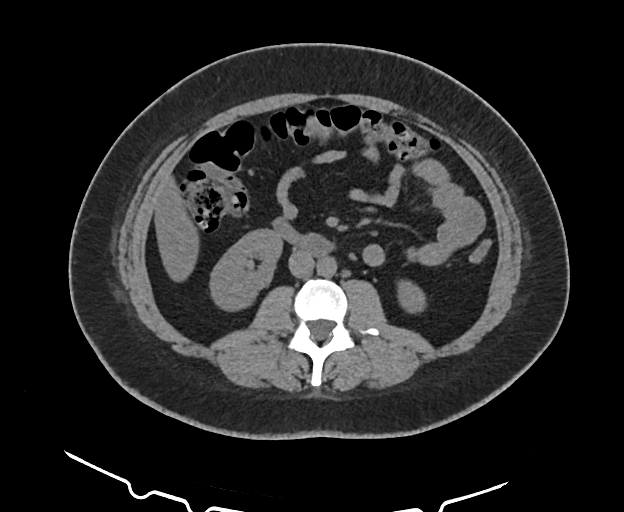
[im 106/137  soft-tissue]
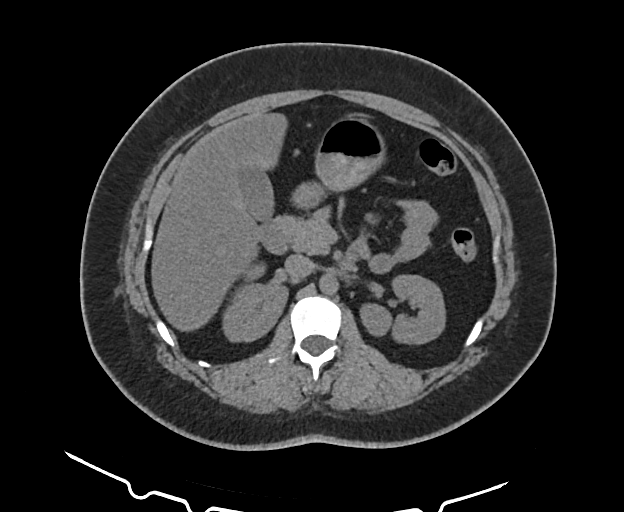
[im 106/137  bone]
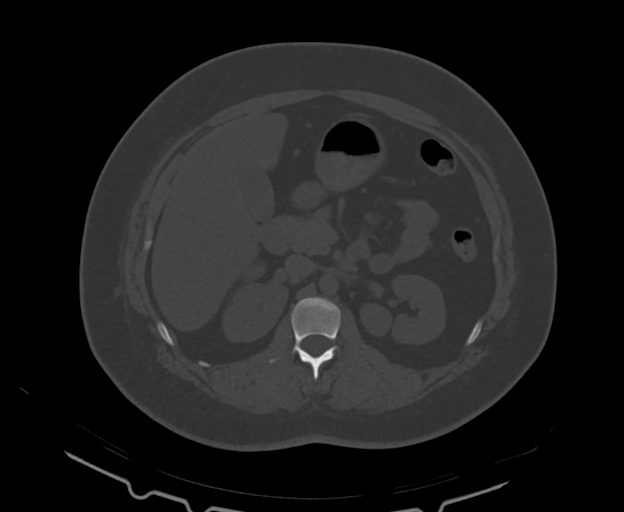
[im 116/137  soft-tissue]
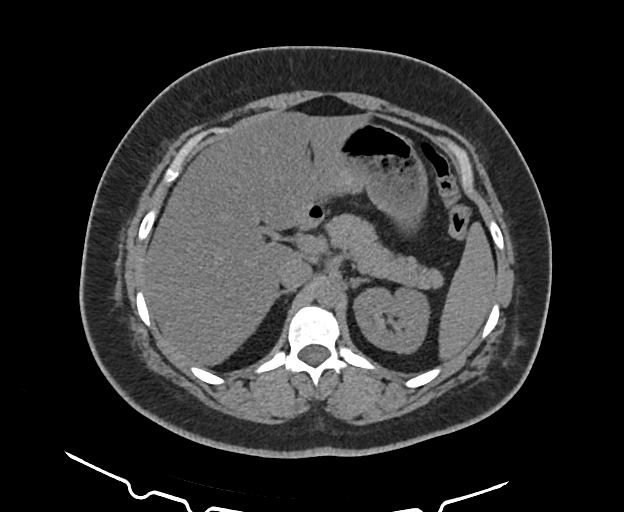
[im 126/137  soft-tissue]
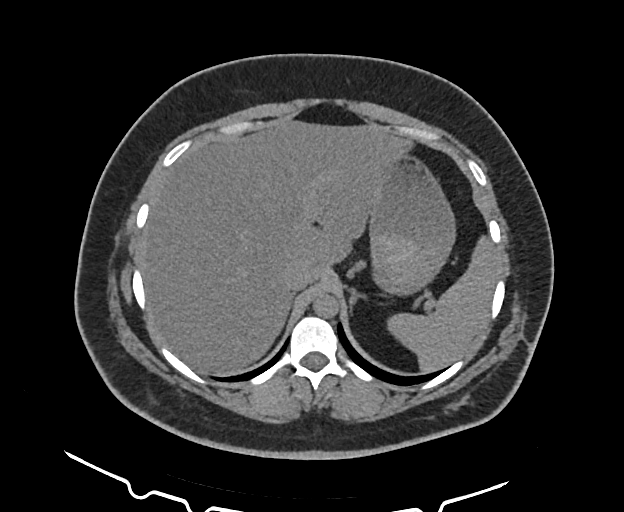

[Series 4: coronal · coronal · 0.81mm/px · 3 of 119 slices shown]
[im 40/119  soft-tissue]
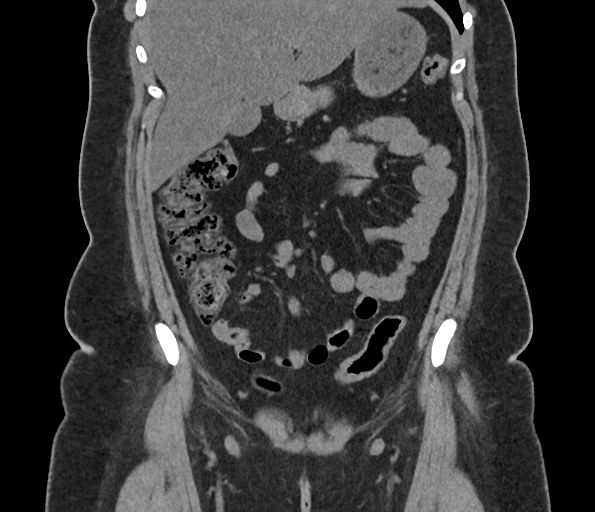
[im 53/119  soft-tissue]
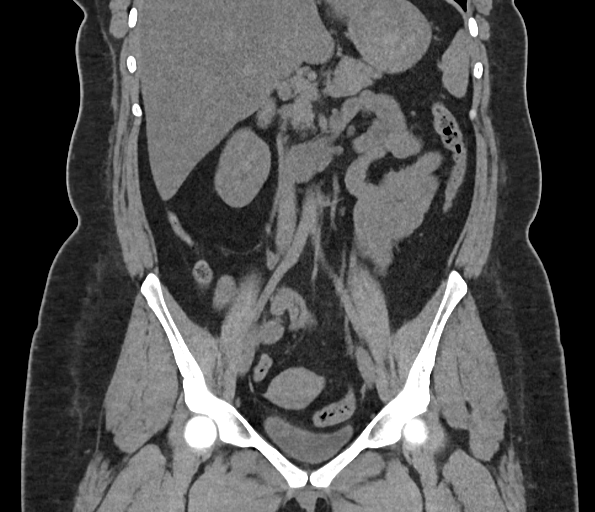
[im 66/119  soft-tissue]
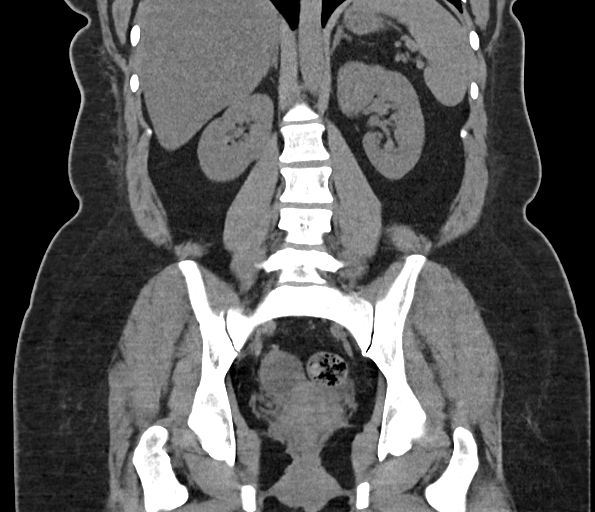

[14 of 46 positions shown; findings below may reference images not displayed]

FINDINGS: Visualized portions of the heart and lung bases are normal.

The liver shows decreased density. The spleen has normal density. Gallbladder, pancreas and adrenals are normal.

The kidneys have normal size and position without hydronephrosis or suspicious abnormality. No abnormal calcifications are appreciated. Ureters have a normal caliber throughout their visualized course to the bladder which is mildly distended and grossly normal.

Uterus and left adnexa have a normal appearance for age. In the right adnexa/ovary there is a 4.5 x 3.8 cm water density structure. 

The GI tract does not appear obstructed. A normal, ascending retrocecal appendix is noted. There is no free fluid or extraluminal gas.

The osseous structures are unremarkable.
IMPRESSION: 1. No CT evidence of genitourinary calculi or obstruction.

2. Right adnexal/ovarian cyst measuring 4.5 x 3.8 cm. 8 week ultrasonographic follow-up may be helpful to evaluate for interval resolution.

3. Fatty infiltration of liver.

Total radiation dose to patient is CTDIvol 12.99 mGy and DLP 573.29 mGy-cm.

## 2020-04-29 IMAGING — US US NON OB TRANSVAGINAL W LTD TA
1 series · 14 of 28 positions shown · non-contrast
Comparison: CT abdomen and pelvis dated 04/27/2020 demonstrates right ovarian/adnexal cyst measuring 45 mm in greatest dimension.

HISTORY: Pain localized to other parts of lower abdomen. History of polycystic ovary disease. Patient currently on metformin.
TECHNIQUE: Transvaginal and transabdominal pelvic ultrasound.

[Series 1: us non ob transvaginal w ltd ta · 14 of 50 slices shown]
[im 2/50]
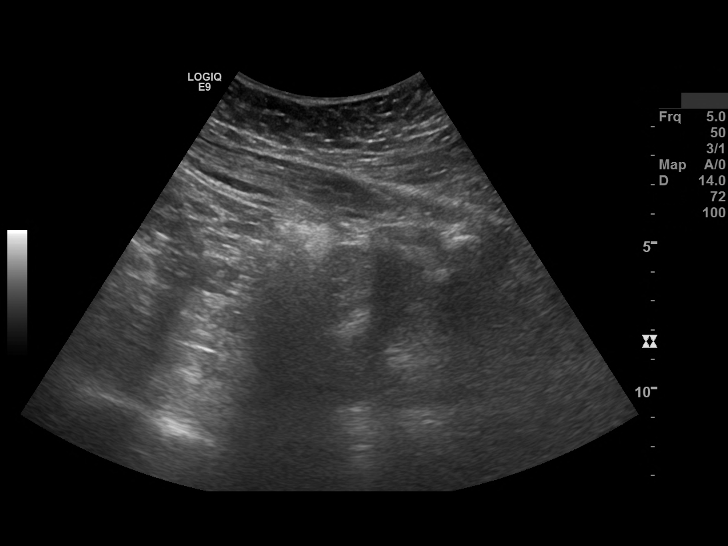
[im 6/50]
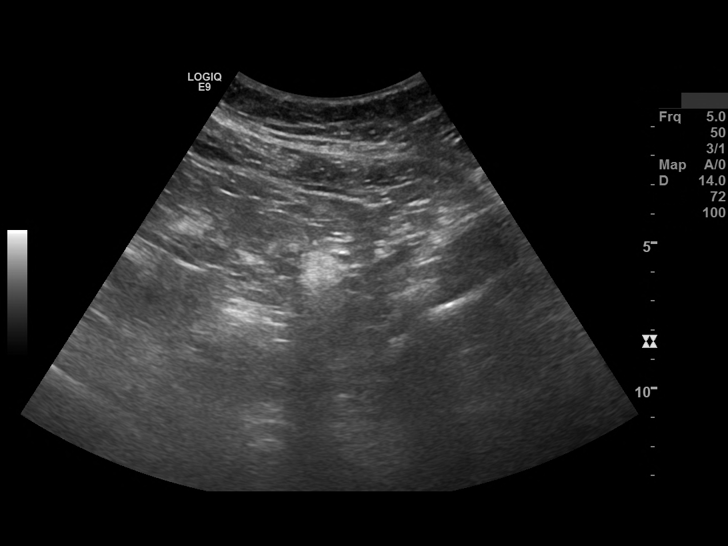
[im 10/50]
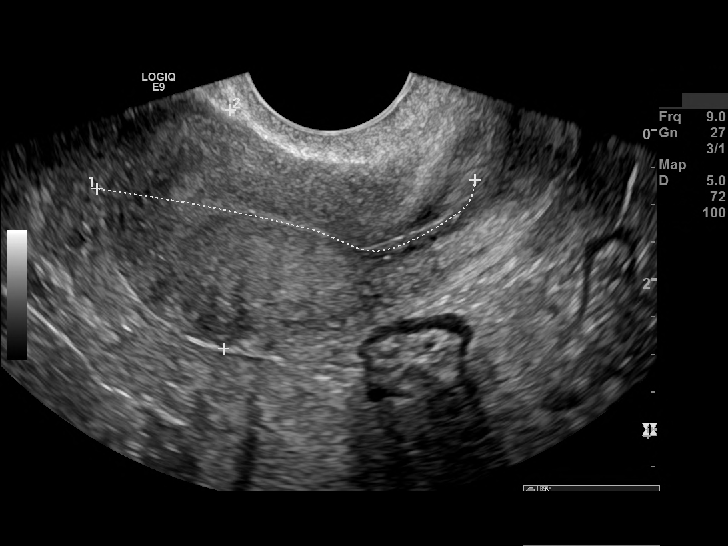
[im 13/50]
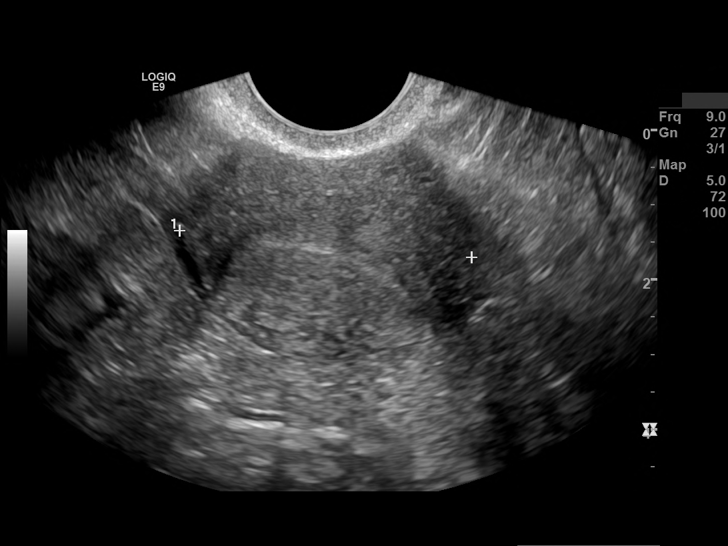
[im 17/50]
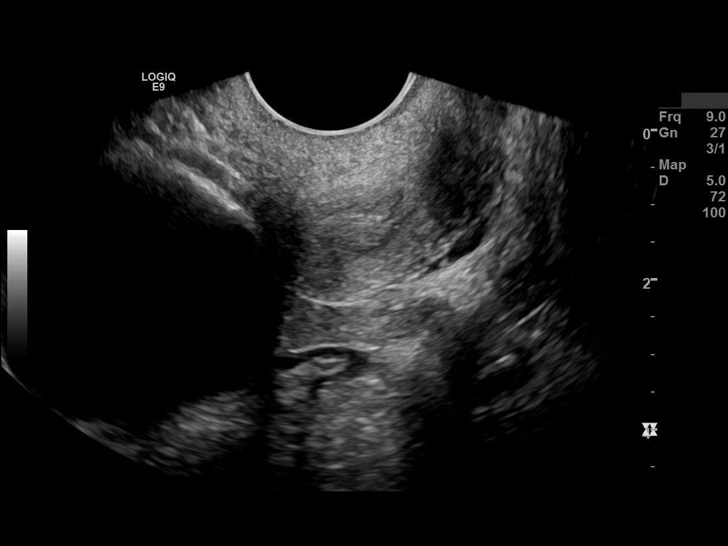
[im 20/50]
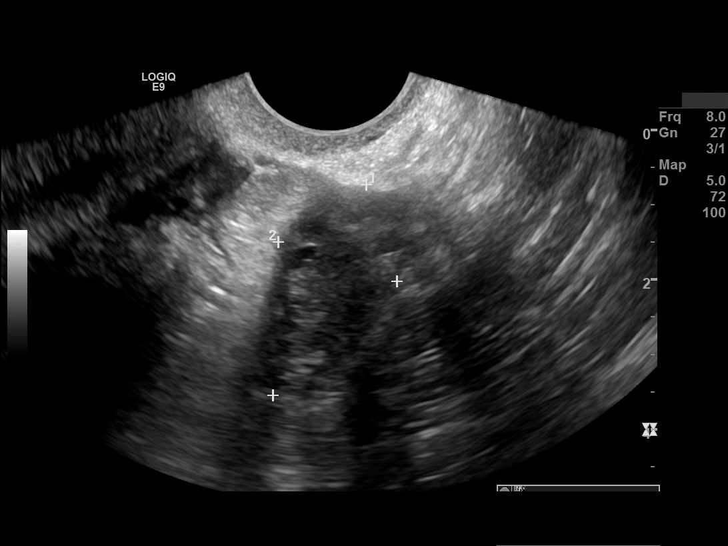
[im 24/50]
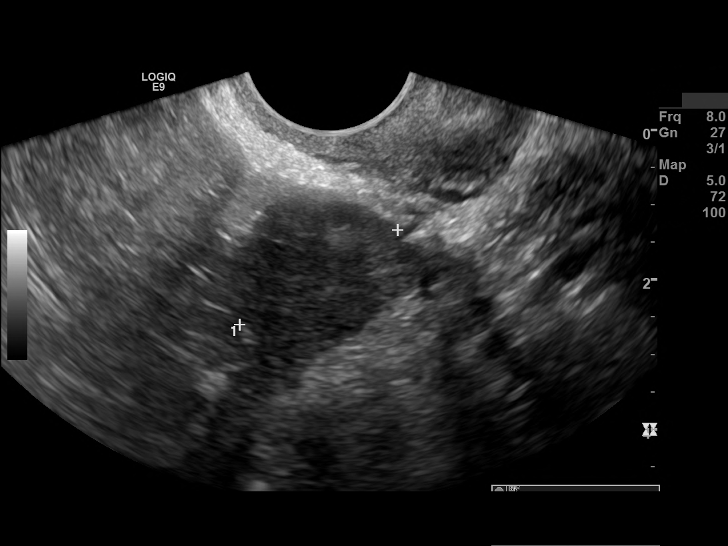
[im 28/50]
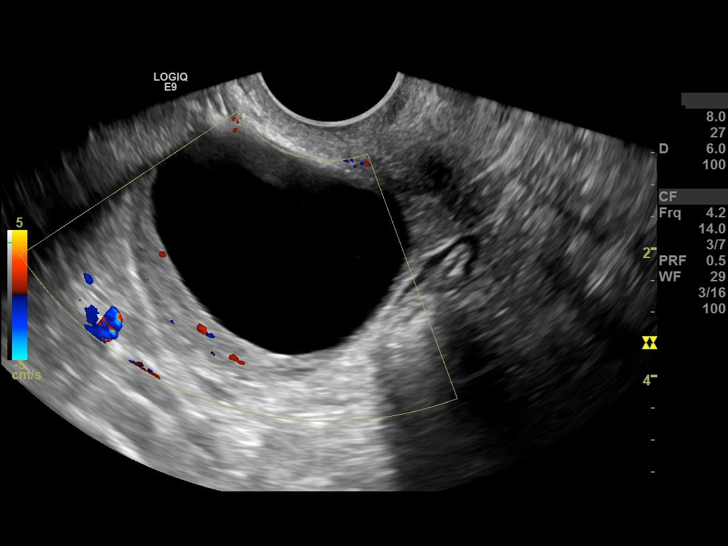
[im 31/50]
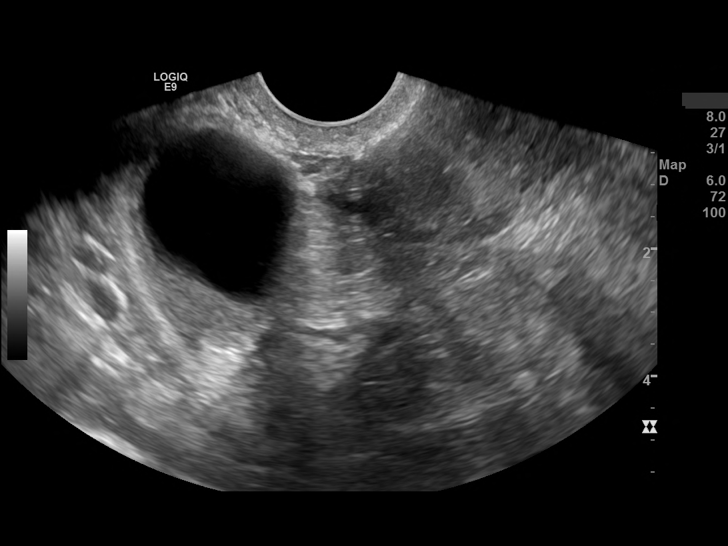
[im 35/50]
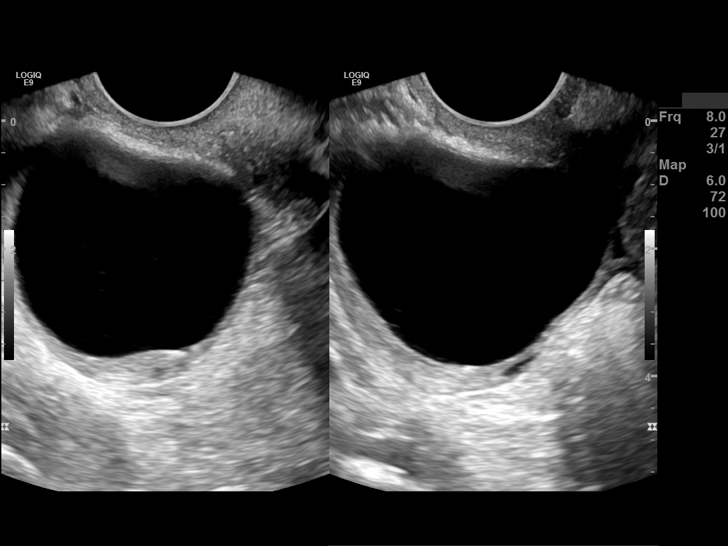
[im 39/50]
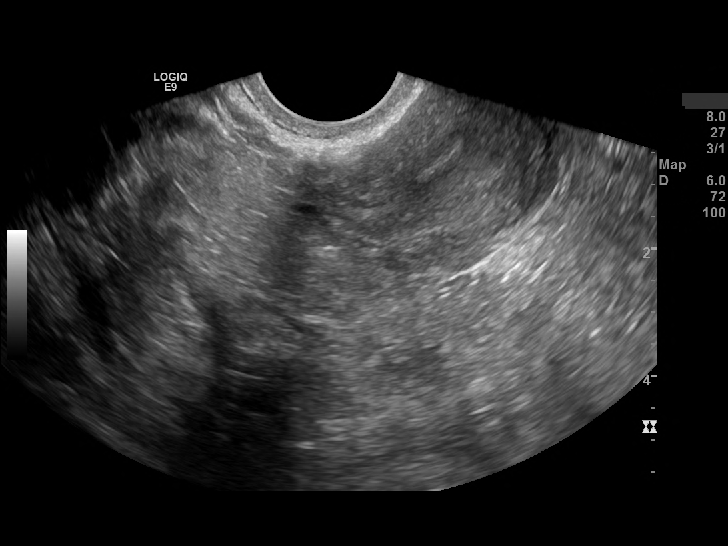
[im 42/50]
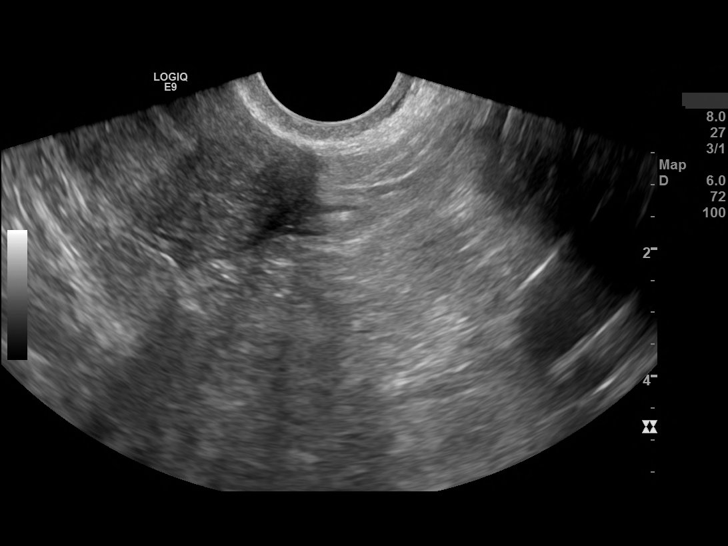
[im 46/50]
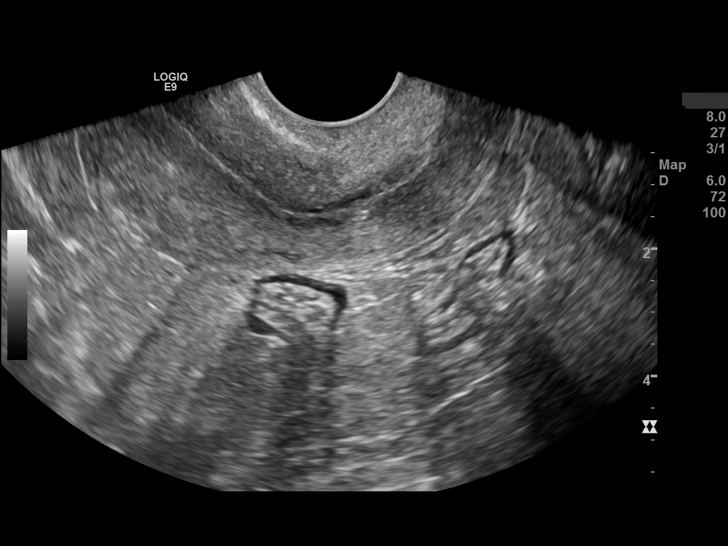
[im 50/50]
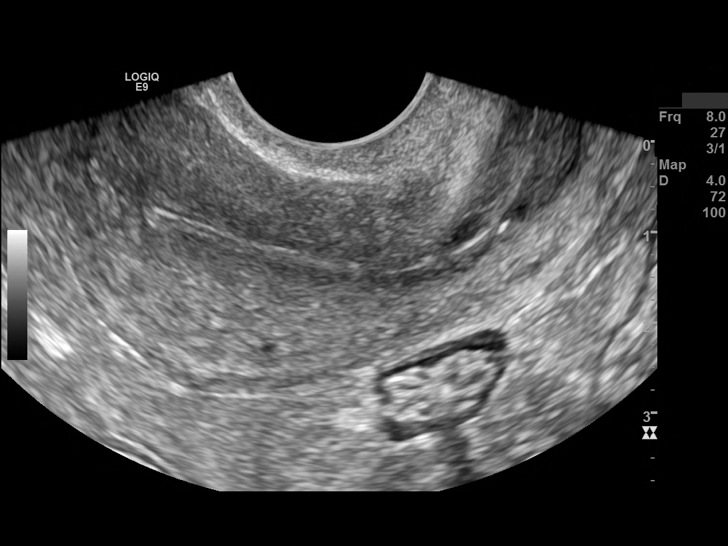

[14 of 28 positions shown; findings below may reference images not displayed]

FINDINGS: The uterus is anteflexed and measures 56 x 32 x 39 mm. Endometrium is 4.2 mm thick and appears uniform.

Right ovary measures 44 x 42 x 43 mm. Large likely functional cysts of the right ovary is 40 x 38 x 38 mm with simple appearance. There is normal color blood flow within the right ovary.

Left ovary measures 31 x 17 x 25 mm and has small peripheral follicles with normal color blood flow. 

No adnexal masses. There is no abnormal fluid in the cul-de-sac.
IMPRESSION: 1. Large, likely functional cyst of the right ovary measuring 40 mm in greatest dimension.

2. Small peripheral follicle appearance of left ovary. Both ovaries have normal color blood flow.

3. Uterus and endometrial stripe are unremarkable. No additional pelvic abnormality is seen.

STAT fax
